# Patient Record
Sex: Female | Born: 1990 | Race: White | Hispanic: No | Marital: Married | Smoking: Former smoker
Health system: Southern US, Community
[De-identification: ages and names within clinical notes are randomized; demographics above are authoritative.]

## PROBLEM LIST (undated history)

## (undated) ENCOUNTER — Inpatient Hospital Stay (HOSPITAL_COMMUNITY): Payer: Self-pay

## (undated) DIAGNOSIS — Z8619 Personal history of other infectious and parasitic diseases: Secondary | ICD-10-CM

## (undated) DIAGNOSIS — IMO0002 Reserved for concepts with insufficient information to code with codable children: Secondary | ICD-10-CM

## (undated) DIAGNOSIS — R87619 Unspecified abnormal cytological findings in specimens from cervix uteri: Secondary | ICD-10-CM

## (undated) DIAGNOSIS — F419 Anxiety disorder, unspecified: Secondary | ICD-10-CM

## (undated) DIAGNOSIS — R87629 Unspecified abnormal cytological findings in specimens from vagina: Secondary | ICD-10-CM

## (undated) DIAGNOSIS — N3010 Interstitial cystitis (chronic) without hematuria: Secondary | ICD-10-CM

## (undated) DIAGNOSIS — F410 Panic disorder [episodic paroxysmal anxiety] without agoraphobia: Secondary | ICD-10-CM

## (undated) HISTORY — PX: BILATERAL SALPINGECTOMY: SHX5743

## (undated) HISTORY — PX: GYNECOLOGIC CRYOSURGERY: SHX857

## (undated) HISTORY — DX: Reserved for concepts with insufficient information to code with codable children: IMO0002

## (undated) HISTORY — DX: Unspecified abnormal cytological findings in specimens from cervix uteri: R87.619

## (undated) HISTORY — DX: Unspecified abnormal cytological findings in specimens from vagina: R87.629

## (undated) HISTORY — DX: Personal history of other infectious and parasitic diseases: Z86.19

---
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
## 2011-04-25 NOTE — L&D Delivery Note (Signed)
Delivery Note  SVD viable female Apgars 8,9 over 2nd degree lac.  Placenta delivered spontaneously intact with 3VC. Repair with 2-0 Chromic with good support and hemostasis noted and R/V exam confirms.  PH art was sent.  [place] cord blood was done.  Mother and baby were doing well.  EBL 300cc  Candice Camp, MD

## 2011-05-02 LAB — OB RESULTS CONSOLE GC/CHLAMYDIA
Chlamydia: NEGATIVE
Gonorrhea: NEGATIVE

## 2011-05-02 LAB — OB RESULTS CONSOLE ABO/RH

## 2011-05-02 LAB — OB RESULTS CONSOLE ANTIBODY SCREEN: Antibody Screen: NEGATIVE

## 2011-06-21 ENCOUNTER — Inpatient Hospital Stay (HOSPITAL_COMMUNITY)
Admission: AD | Admit: 2011-06-21 | Discharge: 2011-06-21 | Disposition: A | Discharge status: Home / Self Care | Payer: Medicaid Other | Source: Ambulatory Visit | Attending: Obstetrics and Gynecology | Admitting: Obstetrics and Gynecology

## 2011-06-21 DIAGNOSIS — A499 Bacterial infection, unspecified: Secondary | ICD-10-CM | POA: Insufficient documentation

## 2011-06-21 DIAGNOSIS — N949 Unspecified condition associated with female genital organs and menstrual cycle: Secondary | ICD-10-CM

## 2011-06-21 DIAGNOSIS — N390 Urinary tract infection, site not specified: Secondary | ICD-10-CM | POA: Insufficient documentation

## 2011-06-21 DIAGNOSIS — O2390 Unspecified genitourinary tract infection in pregnancy, unspecified trimester: Secondary | ICD-10-CM | POA: Insufficient documentation

## 2011-06-21 DIAGNOSIS — O2340 Unspecified infection of urinary tract in pregnancy, unspecified trimester: Secondary | ICD-10-CM

## 2011-06-21 DIAGNOSIS — N760 Acute vaginitis: Secondary | ICD-10-CM | POA: Insufficient documentation

## 2011-06-21 DIAGNOSIS — B9689 Other specified bacterial agents as the cause of diseases classified elsewhere: Secondary | ICD-10-CM

## 2011-06-21 DIAGNOSIS — R1032 Left lower quadrant pain: Secondary | ICD-10-CM | POA: Insufficient documentation

## 2011-06-21 LAB — URINE MICROSCOPIC-ADD ON

## 2011-06-21 LAB — URINALYSIS, ROUTINE W REFLEX MICROSCOPIC
Leukocytes, UA: NEGATIVE
Nitrite: NEGATIVE
Specific Gravity, Urine: >1.03 — ABNORMAL HIGH (ref 1.005–1.030)
pH: 6 (ref 5.0–8.0)

## 2011-06-21 LAB — WET PREP, GENITAL: Yeast Wet Prep HPF POC: NONE SEEN

## 2011-06-21 MED ORDER — METRONIDAZOLE 500 MG PO TABS: 500.0000 mg | ORAL_TABLET | Freq: Two times a day (BID) | ORAL | Status: AC

## 2011-06-21 MED ORDER — CEPHALEXIN 500 MG PO CAPS: 500.0000 mg | ORAL_CAPSULE | Freq: Two times a day (BID) | ORAL | Status: AC

## 2011-06-21 NOTE — ED Provider Notes (Signed)
History     Chief Complaint  Patient presents with  . Abdominal Pain   HPI 21 y.o. G1 at 16 weeks with LLQ pain and dysuria. Pain is worse with activity, resolves with rest. No bleeding, fever/chills, n/v.    No past medical history on file.  No past surgical history on file.  No family history on file.  History  Substance Use Topics  . Smoking status: Not on file  . Smokeless tobacco: Not on file  . Alcohol Use: Not on file    Allergies:  Allergies  Allergen Reactions  . Onion Anaphylaxis and Swelling    Tongue swells and it becomes hard to breathe  . Peanut-Containing Drug Products Swelling    Tongue swells and it becomes hard to breathe     No prescriptions prior to admission    Review of Systems  Constitutional: Negative.   Respiratory: Negative.   Cardiovascular: Negative.   Gastrointestinal: Positive for abdominal pain. Negative for nausea, vomiting, diarrhea and constipation.  Genitourinary: Positive for dysuria. Negative for urgency, frequency, hematuria and flank pain.       Negative for vaginal bleeding, vaginal discharge, dyspareunia  Musculoskeletal: Negative.   Neurological: Negative.   Psychiatric/Behavioral: Negative.    Physical Exam   Blood pressure 105/47, pulse 92, temperature 98.5 F (36.9 C), temperature source Oral, resp. rate 16, height 4\' 11"  (1.499 m), weight 89 lb 8 oz (40.597 kg).  Physical Exam  Nursing note and vitals reviewed. Constitutional: She is oriented to person, place, and time. She appears well-developed and well-nourished. No distress.  Cardiovascular: Normal rate.   Respiratory: Effort normal.  GI: Soft. She exhibits no distension and no mass. There is tenderness (mild LLQ tenderness). There is rebound. There is no guarding.  Genitourinary: Uterus is not enlarged and not tender. Cervix exhibits no motion tenderness, no discharge and no friability. Right adnexum displays no mass, no tenderness and no fullness. Left  adnexum displays no mass, no tenderness and no fullness. No bleeding around the vagina. Vaginal discharge (white) found.       Cervix long and closed  Musculoskeletal: Normal range of motion.  Neurological: She is alert and oriented to person, place, and time.  Skin: Skin is warm and dry.  Psychiatric: She has a normal mood and affect.    MAU Course  Procedures  Results for orders placed during the hospital encounter of 06/21/11 (from the past 24 hour(s))  URINALYSIS, ROUTINE W REFLEX MICROSCOPIC     Status: Abnormal   Collection Time   06/21/11  8:45 PM      Component Value Range   Color, Urine ORANGE (*) YELLOW    APPearance CLEAR  CLEAR    Specific Gravity, Urine >1.030 (*) 1.005 - 1.030    pH 6.0  5.0 - 8.0    Glucose, UA 100 (*) NEGATIVE (mg/dL)   Hgb urine dipstick MODERATE (*) NEGATIVE    Bilirubin Urine NEGATIVE  NEGATIVE    Ketones, ur NEGATIVE  NEGATIVE (mg/dL)   Protein, ur NEGATIVE  NEGATIVE (mg/dL)   Urobilinogen, UA 0.2  0.0 - 1.0 (mg/dL)   Nitrite NEGATIVE  NEGATIVE    Leukocytes, UA NEGATIVE  NEGATIVE   URINE MICROSCOPIC-ADD ON     Status: Abnormal   Collection Time   06/21/11  8:45 PM      Component Value Range   Squamous Epithelial / LPF FEW (*) RARE    WBC, UA 0-2  <3 (WBC/hpf)   RBC / HPF  3-6  <3 (RBC/hpf)   Bacteria, UA FEW (*) RARE   WET PREP, GENITAL     Status: Abnormal   Collection Time   06/21/11  9:45 PM      Component Value Range   Yeast Wet Prep HPF POC NONE SEEN  NONE SEEN    Trich, Wet Prep NONE SEEN  NONE SEEN    Clue Cells Wet Prep HPF POC MODERATE (*) NONE SEEN    WBC, Wet Prep HPF POC FEW (*) NONE SEEN      Assessment and Plan  21 y.o. G1 at 16 weeks Round ligament pain BV - rx flagyl UTI - rx keflex Urine culture sent - pt to call office to f/u on results  Shataria Crist 06/22/2011, 2:22 AM

## 2011-06-21 NOTE — Progress Notes (Signed)
Pt G1 EDC 12/06/2011, having LLQ pain and difficulty with urination.  Denies bleeding.

## 2011-06-24 LAB — GC/CHLAMYDIA PROBE AMP, GENITAL: Chlamydia, DNA Probe: NEGATIVE

## 2011-08-05 ENCOUNTER — Encounter (HOSPITAL_COMMUNITY): Payer: Self-pay | Admitting: *Deleted

## 2011-08-05 ENCOUNTER — Inpatient Hospital Stay (HOSPITAL_COMMUNITY)
Admission: AD | Admit: 2011-08-05 | Discharge: 2011-08-05 | Disposition: A | Discharge status: Home / Self Care | Payer: Medicaid Other | Source: Ambulatory Visit | Attending: Obstetrics and Gynecology | Admitting: Obstetrics and Gynecology

## 2011-08-05 DIAGNOSIS — J069 Acute upper respiratory infection, unspecified: Secondary | ICD-10-CM

## 2011-08-05 DIAGNOSIS — K219 Gastro-esophageal reflux disease without esophagitis: Secondary | ICD-10-CM | POA: Insufficient documentation

## 2011-08-05 DIAGNOSIS — R109 Unspecified abdominal pain: Secondary | ICD-10-CM | POA: Insufficient documentation

## 2011-08-05 DIAGNOSIS — M940 Chondrocostal junction syndrome [Tietze]: Secondary | ICD-10-CM

## 2011-08-05 DIAGNOSIS — R059 Cough, unspecified: Secondary | ICD-10-CM | POA: Insufficient documentation

## 2011-08-05 DIAGNOSIS — R05 Cough: Secondary | ICD-10-CM | POA: Insufficient documentation

## 2011-08-05 HISTORY — DX: Anxiety disorder, unspecified: F41.9

## 2011-08-05 HISTORY — DX: Panic disorder (episodic paroxysmal anxiety): F41.0

## 2011-08-05 LAB — URINALYSIS, ROUTINE W REFLEX MICROSCOPIC
Ketones, ur: NEGATIVE mg/dL
Nitrite: NEGATIVE
Specific Gravity, Urine: 1.01 (ref 1.005–1.030)
pH: 7 (ref 5.0–8.0)

## 2011-08-05 LAB — URINE MICROSCOPIC-ADD ON

## 2011-08-05 MED ORDER — GI COCKTAIL ~~LOC~~
30.0000 mL | Freq: Once | ORAL | Status: AC
  Administered 2011-08-05: 30 mL via ORAL
  Filled 2011-08-05: qty 30

## 2011-08-05 NOTE — MAU Provider Note (Signed)
History   Pt presents today c/o severe heart burn, cough, and abd pain when she coughs. She reports GFM and denies vag bleeding, dc, or any other sx. She was recently diagnosed with a URI and was started on amoxicillin. She is concerned now because of the heartburn and the fact that her chest is painful when touched.  CSN: [id number]  Arrival date and time: 08/05/11 0245   First Provider Initiated Contact with Patient 08/05/11 0350      Chief Complaint  Patient presents with  . Chest Pain  . Shortness of Breath  . Cough  . Sore Throat   HPI  OB History    Grav Para Term Preterm Abortions TAB SAB Ect Mult Living   1               Past Medical History  Diagnosis Date  . Anxiety     Not on meds  . Panic attacks     Not on meds    Past Surgical History  Procedure Date  . Appendectomy 2007  . Cholecystectomy, laparoscopic 2007    Family History  Problem Relation Age of Onset  . Anesthesia problems Other     "allergic to anesthesia"    History  Substance Use Topics  . Smoking status: Former Smoker -- 0.5 packs/day for .5 years    Types: Cigarettes    Quit date: 03/30/2011  . Smokeless tobacco: Not on file  . Alcohol Use: No    Allergies:  Allergies  Allergen Reactions  . Onion Anaphylaxis and Swelling    Tongue swells and it becomes hard to breathe  . Peanut-Containing Drug Products Swelling    Tongue swells and it becomes hard to breathe     Prescriptions prior to admission  Medication Sig Dispense Refill  . amoxicillin (AMOXIL) 250 MG capsule Take 250 mg by mouth 3 (three) times daily.      . Prenatal Vit-Fe Fumarate-FA (PRENATAL MULTIVITAMIN) TABS Take 1 tablet by mouth daily.         Review of Systems  Constitutional: Negative for fever and chills.  Eyes: Negative for blurred vision and double vision.  Respiratory: Positive for cough, sputum production and shortness of breath. Negative for hemoptysis and wheezing.   Cardiovascular: Positive for  chest pain. Negative for palpitations, orthopnea, claudication, leg swelling and PND.  Gastrointestinal: Positive for heartburn and abdominal pain. Negative for nausea, vomiting, diarrhea and constipation.  Genitourinary: Negative for dysuria, urgency, frequency and hematuria.  Neurological: Negative for dizziness and headaches.  Psychiatric/Behavioral: Negative for depression and suicidal ideas.   Physical Exam   Blood pressure 122/62, pulse 89, temperature 98.9 F (37.2 C), temperature source Oral, resp. rate 20, height 4' 11.5" (1.511 m), weight 96 lb 2 oz (43.602 kg), SpO2 100.00%.  Physical Exam  Nursing note and vitals reviewed. Constitutional: She is oriented to person, place, and time. She appears well-developed and well-nourished. No distress.  HENT:  Head: Normocephalic and atraumatic.  Eyes: EOM are normal. Pupils are equal, round, and reactive to light.  Cardiovascular: Normal rate, regular rhythm and normal heart sounds.  Exam reveals no gallop and no friction rub.   No murmur heard. Respiratory: Effort normal and breath sounds normal. No respiratory distress. She has no wheezes. She has no rales. She exhibits tenderness (Pain is exactly reproducible with palpation along sternal border.).  GI: Soft. She exhibits no distension and no mass. There is no tenderness. There is no rebound and no guarding.  Genitourinary:  No bleeding around the vagina. No vaginal discharge found.       Cervix Lg/closed.  Neurological: She is alert and oriented to person, place, and time.  Skin: Skin is warm and dry. She is not diaphoretic.  Psychiatric: She has a normal mood and affect. Her behavior is normal. Judgment and thought content normal.    MAU Course  Procedures  Results for orders placed during the hospital encounter of 08/05/11 (from the past 24 hour(s))  URINALYSIS, ROUTINE W REFLEX MICROSCOPIC     Status: Abnormal   Collection Time   08/05/11  2:50 AM      Component Value Range    Color, Urine YELLOW  YELLOW    APPearance CLEAR  CLEAR    Specific Gravity, Urine 1.010  1.005 - 1.030    pH 7.0  5.0 - 8.0    Glucose, UA NEGATIVE  NEGATIVE (mg/dL)   Hgb urine dipstick SMALL (*) NEGATIVE    Bilirubin Urine NEGATIVE  NEGATIVE    Ketones, ur NEGATIVE  NEGATIVE (mg/dL)   Protein, ur NEGATIVE  NEGATIVE (mg/dL)   Urobilinogen, UA 0.2  0.0 - 1.0 (mg/dL)   Nitrite NEGATIVE  NEGATIVE    Leukocytes, UA MODERATE (*) NEGATIVE   URINE MICROSCOPIC-ADD ON     Status: Abnormal   Collection Time   08/05/11  2:50 AM      Component Value Range   Squamous Epithelial / LPF FEW (*) RARE    WBC, UA 7-10  <3 (WBC/hpf)   RBC / HPF 0-2  <3 (RBC/hpf)   Bacteria, UA RARE  RARE    Urine-Other MUCOUS PRESENT     Urine sent for culture.  Pt heartburn completely resolved following GI Cocktail.  Assessment and Plan  URI: pt is to continue her amoxicillin as directed.  Costochondritis: discussed with pt at length. She may use tylenol for prn pain. Gave precautions.  Abd pain: pt only has pain when she coughs. This should continue to improve.  GERD: advised pt to start Zantac twice daily. She has f/u scheduled with Dr. Vance Gather office. Discussed diet, activity, risks, and precautions.  Clinton Gallant. Nathaly Dawkins III, DrHSc, MPAS, PA-C  08/05/2011, 4:00 AM

## 2011-08-05 NOTE — Discharge Instructions (Signed)
Costochondritis Costochondritis (Tietze syndrome), or costochondral separation, is a swelling and irritation (inflammation) of the tissue (cartilage) that connects your ribs with your breastbone (sternum). It may occur on its own (spontaneously), through damage caused by an accident (trauma), or simply from coughing or minor exercise. It may take up to 6 weeks to get better and longer if you are unable to be conservative in your activities. HOME CARE INSTRUCTIONS   Avoid exhausting physical activity. Try not to strain your ribs during normal activity. This would include any activities using chest, belly (abdominal), and side muscles, especially if heavy weights are used.   Use ice for 15 to 20 minutes per hour while awake for the first 2 days. Place the ice in a plastic bag, and place a towel between the bag of ice and your skin.   Only take over-the-counter or prescription medicines for pain, discomfort, or fever as directed by your caregiver.  SEEK IMMEDIATE MEDICAL CARE IF:   Your pain increases or you are very uncomfortable.   You have a fever.   You develop difficulty with your breathing.   You cough up blood.   You develop worse chest pains, shortness of breath, sweating, or vomiting.   You develop new, unexplained problems (symptoms).  MAKE SURE YOU:   Understand these instructions.   Will watch your condition.   Will get help right away if you are not doing well or get worse.  Document Released: 01/18/2005 Document Revised: 03/30/2011 Document Reviewed: 11/27/2007 [facility] Patient Information 2012 [place], [place].Upper Respiratory Infection, Adult An upper respiratory infection (URI) is also known as the common cold. It is often caused by a type of germ (virus). Colds are easily spread (contagious). You can pass it to others by kissing, coughing, sneezing, or drinking out of the same glass. Usually, you get better in 1 or 2 weeks.  HOME CARE   Only take medicine as told by  your doctor.   Use a warm mist humidifier or breathe in steam from a hot shower.   Drink enough water and fluids to keep your pee (urine) clear or pale yellow.   Get plenty of rest.   Return to work when your temperature is back to normal or as told by your doctor. You may use a face mask and wash your hands to stop your cold from spreading.  GET HELP RIGHT AWAY IF:   After the first few days, you feel you are getting worse.   You have questions about your medicine.   You have chills, shortness of breath, or brown or red spit (mucus).   You have yellow or brown snot (nasal discharge) or pain in the face, especially when you bend forward.   You have a fever, puffy (swollen) neck, pain when you swallow, or white spots in the back of your throat.   You have a bad headache, ear pain, sinus pain, or chest pain.   You have a high-pitched whistling sound when you breathe in and out (wheezing).   You have a lasting cough or cough up blood.   You have sore muscles or a stiff neck.  MAKE SURE YOU:   Understand these instructions.   Will watch your condition.   Will get help right away if you are not doing well or get worse.  Document Released: 09/27/2007 Document Revised: 03/30/2011 Document Reviewed: 08/15/2010 [facility] Patient Information 2012 [place], [place].

## 2011-08-05 NOTE — MAU Note (Signed)
Pt states, " Three days ago I woke up and couldn't talk, and then that night my throat was  sore. I went to an urgent care in [place] and they gave me amoxicillin 250mg  three times a day. That night I started getting acid reflux, and it has continued until now my chest just hurts. I have a dry cough, and when I cough it hurts in my right lower abdomen. I've had a low back ache for three days."  .

## 2011-08-06 LAB — URINE CULTURE
Colony Count: NO GROWTH
Culture  Setup Time: 201304131127
Culture: NO GROWTH

## 2011-09-11 ENCOUNTER — Inpatient Hospital Stay (HOSPITAL_COMMUNITY)
Admission: AD | Admit: 2011-09-11 | Discharge: 2011-09-11 | Disposition: A | Discharge status: Home / Self Care | Payer: Medicaid Other | Source: Ambulatory Visit | Attending: Obstetrics and Gynecology | Admitting: Obstetrics and Gynecology

## 2011-09-11 ENCOUNTER — Inpatient Hospital Stay (HOSPITAL_COMMUNITY): Payer: Medicaid Other

## 2011-09-11 DIAGNOSIS — O26879 Cervical shortening, unspecified trimester: Secondary | ICD-10-CM

## 2011-09-11 DIAGNOSIS — N949 Unspecified condition associated with female genital organs and menstrual cycle: Secondary | ICD-10-CM

## 2011-09-11 DIAGNOSIS — O479 False labor, unspecified: Secondary | ICD-10-CM

## 2011-09-11 DIAGNOSIS — O4700 False labor before 37 completed weeks of gestation, unspecified trimester: Secondary | ICD-10-CM | POA: Insufficient documentation

## 2011-09-11 LAB — URINALYSIS, ROUTINE W REFLEX MICROSCOPIC
Bilirubin Urine: NEGATIVE
Ketones, ur: NEGATIVE mg/dL
Nitrite: NEGATIVE
Protein, ur: NEGATIVE mg/dL
Specific Gravity, Urine: 1.01 (ref 1.005–1.030)
Urobilinogen, UA: 0.2 mg/dL (ref 0.0–1.0)

## 2011-09-11 LAB — URINE MICROSCOPIC-ADD ON

## 2011-09-11 NOTE — MAU Provider Note (Signed)
Chief Complaint:  No chief complaint on file.      HPI  Melissa Ware is a 21 y.o. G1P0 at [redacted]w[redacted]d presenting with 3 day hx of left groin pain radiating to left thigh. The pain is constant and is worse when she sits. She is also having intermittent brief sharp left mid back pain. Also feels abdomen "tightening." Denies irritative vaginal discharge, leakage of fluid or vaginal bleeding. Good fetal movement.   Pregnancy Course: uncomplicated except UTI x1; hx cryo  Past Medical History: Past Medical History  Diagnosis Date  . Anxiety     Not on meds  . Panic attacks     Not on meds    Past Surgical History: Past Surgical History  Procedure Date  . Appendectomy 2007  . Cholecystectomy, laparoscopic 2007    Family History: Family History  Problem Relation Age of Onset  . Anesthesia problems Other     "allergic to anesthesia"    Social History: History  Substance Use Topics  . Smoking status: Former Smoker -- 0.5 packs/day for .5 years    Types: Cigarettes    Quit date: 03/30/2011  . Smokeless tobacco: Not on file  . Alcohol Use: No    Allergies:  Allergies  Allergen Reactions  . Onion Anaphylaxis and Swelling    Tongue swells and it becomes hard to breathe  . Peanut-Containing Drug Products Swelling    Tongue swells and it becomes hard to breathe     Meds:  Prescriptions prior to admission  Medication Sig Dispense Refill  . Prenatal Vit-Fe Fumarate-FA (PRENATAL MULTIVITAMIN) TABS Take 1 tablet by mouth daily.           Physical Exam  Blood pressure 147/98, pulse 116, temperature 98.3 F (36.8 C), temperature source Oral, resp. rate 20, SpO2 98.00%. GENERAL: Well-developed, well-nourished female in no acute distress.  HEENT: normocephalic, good dentition HEART: normal rate RESP: normal effort ABDOMEN: Soft, nontender, nondistended, gravid.  EXTREMITIES: Nontender, no edema NEURO: alert and oriented VE: closed/50/-3 cephalic     FHT:  Baseline  135-140 , moderate variability, accelerations present, no decelerations Contractions: q 1.5-4 mins with uterine irritability   Labs:  Results for orders placed during the hospital encounter of 09/11/11 (from the past 24 hour(s))  URINALYSIS, ROUTINE W REFLEX MICROSCOPIC     Status: Abnormal   Collection Time   09/11/11  9:00 PM      Component Value Range   Color, Urine YELLOW  YELLOW    APPearance CLEAR  CLEAR    Specific Gravity, Urine 1.010  1.005 - 1.030    pH 6.0  5.0 - 8.0    Glucose, UA NEGATIVE  NEGATIVE (mg/dL)   Hgb urine dipstick TRACE (*) NEGATIVE    Bilirubin Urine NEGATIVE  NEGATIVE    Ketones, ur NEGATIVE  NEGATIVE (mg/dL)   Protein, ur NEGATIVE  NEGATIVE (mg/dL)   Urobilinogen, UA 0.2  0.0 - 1.0 (mg/dL)   Nitrite NEGATIVE  NEGATIVE    Leukocytes, UA NEGATIVE  NEGATIVE   URINE MICROSCOPIC-ADD ON     Status: Abnormal   Collection Time   09/11/11  9:00 PM      Component Value Range   Squamous Epithelial / LPF RARE  RARE    WBC, UA 0-2  <3 (WBC/hpf)   RBC / HPF 3-6  <3 (RBC/hpf)   Bacteria, UA FEW (*) RARE   FETAL FIBRONECTIN     Status: Normal   Collection Time   09/11/11  9:56 PM      Component Value Range   Fetal Fibronectin NEGATIVE  NEGATIVE   13440   Imaging:  [place] : cx length 3.1  MAU Course: After po hydration UC's diminished to UI and after return from [place] pain resolved  Assessment: G1 @ [redacted]w[redacted]d Preterm contractions   Plan: C/W Dr. Arelia Sneddon. Discharge. Stay well hydrated, pelvic rest until next PNV. Call office if abdominal tightening recurs.     Melissa Ware 5/20/201310:03 PM

## 2011-09-11 NOTE — MAU Note (Signed)
C/o intermittant pain in left groin area that shoots into left upper thigh.  Also states of back pain.

## 2011-10-07 ENCOUNTER — Encounter (HOSPITAL_COMMUNITY): Payer: Self-pay | Admitting: *Deleted

## 2011-10-07 ENCOUNTER — Inpatient Hospital Stay (HOSPITAL_COMMUNITY)
Admission: AD | Admit: 2011-10-07 | Discharge: 2011-10-07 | Disposition: A | Discharge status: Home / Self Care | Payer: Medicaid Other | Source: Ambulatory Visit | Attending: Obstetrics and Gynecology | Admitting: Obstetrics and Gynecology

## 2011-10-07 DIAGNOSIS — N949 Unspecified condition associated with female genital organs and menstrual cycle: Secondary | ICD-10-CM | POA: Insufficient documentation

## 2011-10-07 DIAGNOSIS — Z331 Pregnant state, incidental: Secondary | ICD-10-CM

## 2011-10-07 DIAGNOSIS — O2390 Unspecified genitourinary tract infection in pregnancy, unspecified trimester: Secondary | ICD-10-CM | POA: Insufficient documentation

## 2011-10-07 DIAGNOSIS — R109 Unspecified abdominal pain: Secondary | ICD-10-CM | POA: Insufficient documentation

## 2011-10-07 DIAGNOSIS — N390 Urinary tract infection, site not specified: Secondary | ICD-10-CM

## 2011-10-07 LAB — URINALYSIS, ROUTINE W REFLEX MICROSCOPIC
Glucose, UA: NEGATIVE mg/dL
Nitrite: NEGATIVE
Specific Gravity, Urine: 1.02 (ref 1.005–1.030)
pH: 7 (ref 5.0–8.0)

## 2011-10-07 LAB — URINE MICROSCOPIC-ADD ON

## 2011-10-07 MED ORDER — NITROFURANTOIN MONOHYD MACRO 100 MG PO CAPS: 100.0000 mg | ORAL_CAPSULE | Freq: Two times a day (BID) | ORAL | Status: AC

## 2011-10-07 NOTE — MAU Provider Note (Signed)
History     CSN: [id number]  Arrival date and time: 10/07/11 1215   First Provider Initiated Contact with Patient 10/07/11 1422      Chief Complaint  Patient presents with  . Abdominal Pain  . Vaginal Discharge   HPI  Patient is [redacted] weeks pregnant, for past three days has felt pressure, having vaginal discharge white to clear, no odor or itching.  Denies vaginal bleeding.  +fetal movement.  Last intercourse in November 2012.   Past Medical History  Diagnosis Date  . Anxiety     Not on meds  . Panic attacks     Not on meds    Past Surgical History  Procedure Date  . Appendectomy 2007  . Cholecystectomy, laparoscopic 2007    Family History  Problem Relation Age of Onset  . Anesthesia problems Other     "allergic to anesthesia"    History  Substance Use Topics  . Smoking status: Former Smoker -- 0.5 packs/day for .5 years    Types: Cigarettes    Quit date: 03/30/2011  . Smokeless tobacco: Not on file  . Alcohol Use: No    Allergies:  Allergies  Allergen Reactions  . Onion Anaphylaxis and Swelling    Tongue swells and it becomes hard to breathe  . Peanut-Containing Drug Products Swelling    Tongue swells and it becomes hard to breathe     Prescriptions prior to admission  Medication Sig Dispense Refill  . acetaminophen (TYLENOL) 325 MG tablet Take 650 mg by mouth every 6 (six) hours as needed. For pain.      . calcium carbonate (TUMS - DOSED IN MG ELEMENTAL CALCIUM) 500 MG chewable tablet Chew 1 tablet by mouth daily. For heartburn.      . Prenatal Vit-Fe Fumarate-FA (PRENATAL MULTIVITAMIN) TABS Take 1 tablet by mouth daily.       . ranitidine (ZANTAC) 75 MG tablet Take 75 mg by mouth 2 (two) times daily. For heartburn.        Review of Systems  Genitourinary:       Pelvic pressure, vaginal discharge  All other systems reviewed and are negative.   Physical Exam   Blood pressure 115/67, pulse 104, temperature 98.4 F (36.9 C), temperature source  Oral, resp. rate 16, height 4\' 11"  (1.499 m), weight 49.261 kg (108 lb 9.6 oz).  Physical Exam  Constitutional: She is oriented to person, place, and time. She appears well-developed and well-nourished. No distress.  HENT:  Head: Normocephalic.  Neck: Normal range of motion. Neck supple.  Cardiovascular: Normal rate, regular rhythm and normal heart sounds.   Respiratory: Effort normal and breath sounds normal.  GI: Soft. There is no tenderness.  Genitourinary: No bleeding around the vagina. Vaginal discharge (mucusy) found.       [place] neg; pooling negative Cervix - closed  Neurological: She is alert and oriented to person, place, and time.  Skin: Skin is warm and dry.    MAU Course  Procedures  Results for orders placed during the hospital encounter of 10/07/11 (from the past 24 hour(s))  URINALYSIS, ROUTINE W REFLEX MICROSCOPIC     Status: Abnormal   Collection Time   10/07/11 12:30 PM      Component Value Range   Color, Urine YELLOW  YELLOW   APPearance HAZY (*) CLEAR   Specific Gravity, Urine 1.020  1.005 - 1.030   pH 7.0  5.0 - 8.0   Glucose, UA NEGATIVE  NEGATIVE mg/dL   Hgb  urine dipstick SMALL (*) NEGATIVE   Bilirubin Urine NEGATIVE  NEGATIVE   Ketones, ur NEGATIVE  NEGATIVE mg/dL   Protein, ur NEGATIVE  NEGATIVE mg/dL   Urobilinogen, UA 0.2  0.0 - 1.0 mg/dL   Nitrite NEGATIVE  NEGATIVE   Leukocytes, UA SMALL (*) NEGATIVE  URINE MICROSCOPIC-ADD ON     Status: Abnormal   Collection Time   10/07/11 12:30 PM      Component Value Range   Squamous Epithelial / LPF FEW (*) RARE   WBC, UA 7-10  <3 WBC/hpf   RBC / HPF 7-10  <3 RBC/hpf   Bacteria, UA FEW (*) RARE  WET PREP, GENITAL     Status: Abnormal   Collection Time   10/07/11  2:40 PM      Component Value Range   Yeast Wet Prep HPF POC NONE SEEN  NONE SEEN   Trich, Wet Prep NONE SEEN  NONE SEEN   Clue Cells Wet Prep HPF POC NONE SEEN  NONE SEEN   WBC, Wet Prep HPF POC FEW (*) NONE SEEN   FHR 130's, +accels,  reactive [place] - intermittent, not regular  Assessment and Plan  UTI  Plan: DC to home RX Macrobid Urine Culture Keep scheduled appt  [facility] 10/07/2011, 2:23 PM

## 2011-10-07 NOTE — Discharge Instructions (Signed)
Urinary Tract Infection in Pregnancy A urinary tract infection (UTI) is a bacterial infection of the urinary tract. Infection of the urinary tract can include the ureters, kidneys (pyelonephritis), bladder (cystitis), and urethra (urethritis). All pregnant women should be screened for bacteria in the urinary tract. Identifying and treating a UTI will decrease the risk of preterm labor and developing more serious infections in both the mother and baby. CAUSES Bacteria germs cause almost all UTIs. There are many factors that can increase your chances of getting a UTI during pregnancy. These include:  Having a short urethra.   Poor toilet and hygiene habits.   Sexual intercourse.   Blockage of urine along the urinary tract.   Problems with the pelvic muscles or nerves.   Diabetes.   Obesity.   Bladder problems after having several children.   Previous history of UTI.  SYMPTOMS   Pain, burning, or a stinging feeling when urinating.   Suddenly feeling the need to urinate right away (urgency).   Loss of bladder control (urinary incontinence).   Frequent urination, more than is common with pregnancy.   Lower abdominal or back discomfort.   Bad smelling urine.   Cloudy urine.   Blood in the urine (hematuria).   Fever.  When the kidneys are infected, the symptoms may be:  Back pain.   Flank pain on the right side more so than the left.   Fever.   Chills.   Nausea.   Vomiting.  DIAGNOSIS   Urine tests.   Additional tests and procedures may include:   Ultrasound of the kidneys, ureters, bladder, and urethra.   Looking in the bladder with a lighted tube (cystoscopy).   Certain X-ray studies only when absolutely necessary.  Finding out the results of your test Ask when your test results will be ready. Make sure you get your test results. TREATMENT  Antibiotic medicine by mouth.   Antibiotics given through the vein (intravenously), if needed.  HOME CARE  INSTRUCTIONS   Take your antibiotics as directed. Finish them even if you start to feel better. Only take medicine as directed by your caregiver.   Drink enough fluids to keep your urine clear or pale yellow.   Do not have sexual intercourse until the infection is gone and your caregiver says it is okay.   Make sure you are tested for UTIs throughout your pregnancy if you get one. These infections often come back.  Preventing a UTI in the future:  Practice good toilet habits. Always wipe from front to back. Use the tissue only once.   Do not hold your urine. Empty your bladder as soon as possible when the urge comes.   Do not douche or use deodorant sprays.   Wash with soap and warm water around the genital area and the anus.   Empty your bladder before and after sexual intercourse.   Wear underwear with a cotton crotch.   Avoid caffeine and carbonated drinks. They can irritate the bladder.   Drink cranberry juice or take cranberry pills. This may decrease the risk of getting a UTI.   Do not drink alcohol.   Keep all your appointments and tests as scheduled.  SEEK MEDICAL CARE IF:   Your symptoms get worse.   You are still having fevers 2 or more days after treatment begins.   You develop a rash.   You feel that you are having problems with medicines prescribed.   You develop abnormal vaginal discharge.  SEEK IMMEDIATE MEDICAL   CARE IF:   You develop back or flank pain.   You develop chills.   You have blood in your urine.   You develop nausea and vomiting.   You develop contractions of your uterus.   You have a gush of fluid from the vagina.  MAKE SURE YOU:   Understand these instructions.   Will watch your condition.   Will get help right away if you are not doing well or get worse.  Document Released: 08/05/2010 Document Revised: 03/30/2011 Document Reviewed: 08/05/2010 [employer] Patient Information 2012 [employer]. 

## 2011-10-07 NOTE — MAU Note (Signed)
Called on call nurse from doctors office. Having some leaking and vaginal pressure.

## 2011-10-07 NOTE — MAU Note (Signed)
Patient is [redacted] weeks pregnant, for past few days has felt pressure, having vaginal discharge white to clear.

## 2011-10-20 ENCOUNTER — Inpatient Hospital Stay (HOSPITAL_COMMUNITY)
Admission: AD | Admit: 2011-10-20 | Discharge: 2011-10-20 | Disposition: A | Discharge status: Home / Self Care | Payer: PRIVATE HEALTH INSURANCE | Source: Ambulatory Visit | Attending: Obstetrics and Gynecology | Admitting: Obstetrics and Gynecology

## 2011-10-20 ENCOUNTER — Encounter (HOSPITAL_COMMUNITY): Payer: Self-pay | Admitting: *Deleted

## 2011-10-20 DIAGNOSIS — A499 Bacterial infection, unspecified: Secondary | ICD-10-CM | POA: Insufficient documentation

## 2011-10-20 DIAGNOSIS — O4700 False labor before 37 completed weeks of gestation, unspecified trimester: Secondary | ICD-10-CM

## 2011-10-20 DIAGNOSIS — B9689 Other specified bacterial agents as the cause of diseases classified elsewhere: Secondary | ICD-10-CM | POA: Insufficient documentation

## 2011-10-20 DIAGNOSIS — N760 Acute vaginitis: Secondary | ICD-10-CM | POA: Insufficient documentation

## 2011-10-20 DIAGNOSIS — O479 False labor, unspecified: Secondary | ICD-10-CM

## 2011-10-20 DIAGNOSIS — O2390 Unspecified genitourinary tract infection in pregnancy, unspecified trimester: Secondary | ICD-10-CM | POA: Insufficient documentation

## 2011-10-20 LAB — WET PREP, GENITAL
Trich, Wet Prep: NONE SEEN
Yeast Wet Prep HPF POC: NONE SEEN

## 2011-10-20 LAB — URINALYSIS, ROUTINE W REFLEX MICROSCOPIC
Bilirubin Urine: NEGATIVE
Protein, ur: NEGATIVE mg/dL
Urobilinogen, UA: 0.2 mg/dL (ref 0.0–1.0)

## 2011-10-20 LAB — URINE MICROSCOPIC-ADD ON

## 2011-10-20 MED ORDER — NIFEDIPINE 10 MG PO CAPS: 10.0000 mg | ORAL_CAPSULE | Freq: Three times a day (TID) | ORAL | Status: DC

## 2011-10-20 MED ORDER — TERBUTALINE SULFATE 1 MG/ML IJ SOLN
0.2500 mg | Freq: Once | INTRAMUSCULAR | Status: DC
  Filled 2011-10-20: qty 1

## 2011-10-20 MED ORDER — METRONIDAZOLE 500 MG PO TABS: 500.0000 mg | ORAL_TABLET | Freq: Two times a day (BID) | ORAL | Status: AC

## 2011-10-20 NOTE — MAU Note (Signed)
Patient states she was sent from the office for evaluation of preterm labor. Was 50% and closed in the office. Having back and abdominal pain. Denies bleeding or leaking and reports good fetal movement.

## 2011-10-20 NOTE — Discharge Instructions (Signed)
Preventing Preterm Labor Preterm labor is when a pregnant woman has contractions that cause the cervix to open, shorten, and thin before 37 weeks of pregnancy. You will have regular contractions (tightening) 2 to 3 minutes apart. This usually causes discomfort or pain. HOME CARE  Eat a healthy diet.   Take your vitamins as told by your doctor.   Drink enough fluids to keep your pee (urine) clear or pale yellow every day.   Get rest and sleep.   Do not have sex if you are at high risk for preterm labor.   Follow your doctor's advice about activity, medicines, and tests.   Avoid stress.   Avoid hard labor or exercise that lasts for a long time.   Do not smoke.  GET HELP RIGHT AWAY IF:   You are having contractions.   You have belly (abdominal) pain.   You have bleeding from your vagina.   You have pain when you pee (urinate).   You have abnormal discharge from your vagina.   You have a temperature by mouth above 102 F (38.9 C).  MAKE SURE YOU:  Understand these instructions.   Will watch your condition.   Will get help if you are not doing well or get worse.  Document Released: 07/07/2008 Document Revised: 03/30/2011 Document Reviewed: 07/07/2008 [employer] Patient Information 2012 [employer]. 

## 2011-10-20 NOTE — MAU Provider Note (Addendum)
History     CSN: [id number]  Arrival date and time: 10/20/11 1207   First Provider Initiated Contact with Patient 10/20/11 1420      Chief Complaint  Patient presents with  . Labor Eval   HPI This is a 21 yo G1 at 33.2 weeks seen at [facility] who was sent here from the clinic due to contractions.  Contractions started last night around 10pm and are regular - approximately every 4-5 minutes.  No change in severity - noted to be moderate.  No provoking or palliating factors.  Was closed and 50% in office.  Denies brownish discharge.  OB History    Grav Para Term Preterm Abortions TAB SAB Ect Mult Living   1               Past Medical History  Diagnosis Date  . Anxiety     Not on meds  . Panic attacks     Not on meds    Past Surgical History  Procedure Date  . Appendectomy 2007  . Cholecystectomy, laparoscopic 2007    Family History  Problem Relation Age of Onset  . Anesthesia problems Other     "allergic to anesthesia"  . Other Neg Hx     History  Substance Use Topics  . Smoking status: Former Smoker -- 0.5 packs/day for .5 years    Types: Cigarettes    Quit date: 03/30/2011  . Smokeless tobacco: Not on file  . Alcohol Use: No    Allergies:  Allergies  Allergen Reactions  . Onion Anaphylaxis and Swelling    Tongue swells and it becomes hard to breathe  . Peanut-Containing Drug Products Swelling    Tongue swells and it becomes hard to breathe     Prescriptions prior to admission  Medication Sig Dispense Refill  . acetaminophen (TYLENOL) 325 MG tablet Take 650 mg by mouth every 6 (six) hours as needed. For pain.      . calcium carbonate (TUMS - DOSED IN MG ELEMENTAL CALCIUM) 500 MG chewable tablet Chew 1 tablet by mouth daily. For heartburn.      . Prenatal Vit-Fe Fumarate-FA (PRENATAL MULTIVITAMIN) TABS Take 1 tablet by mouth daily.       . ranitidine (ZANTAC) 75 MG tablet Take 75 mg by mouth 2 (two) times daily. For heartburn.         ROS Physical Exam   Blood pressure 117/73, pulse 96, temperature 98.7 F (37.1 C), temperature source Oral, resp. rate 16, height 4' 11.5" (1.511 m), weight 50.44 kg (111 lb 3.2 oz), SpO2 99.00%.  Physical Exam  Constitutional: She is oriented to person, place, and time. She appears well-developed and well-nourished.  HENT:  Head: Normocephalic and atraumatic.  GI: Soft. She exhibits no distension and no mass. There is no tenderness. There is no rebound and no guarding.  Genitourinary: Vagina normal.       Cervix visually closed.  No discharge.  Musculoskeletal: She exhibits no edema.  Neurological: She is alert and oriented to person, place, and time.  Psychiatric: She has a normal mood and affect. Her behavior is normal. Judgment and thought content normal.   Dilation: Closed Effacement (%): 50 Station: -2 Exam by:: Dr. Adrian Blackwater  Results for orders placed during the hospital encounter of 10/20/11 (from the past 24 hour(s))  WET PREP, GENITAL     Status: Abnormal   Collection Time   10/20/11  1:15 PM      Component Value Range  Yeast Wet Prep HPF POC NONE SEEN  NONE SEEN   Trich, Wet Prep NONE SEEN  NONE SEEN   Clue Cells Wet Prep HPF POC FEW (*) NONE SEEN   WBC, Wet Prep HPF POC FEW (*) NONE SEEN  URINALYSIS, ROUTINE W REFLEX MICROSCOPIC     Status: Abnormal   Collection Time   10/20/11  1:40 PM      Component Value Range   Color, Urine YELLOW  YELLOW   APPearance CLEAR  CLEAR   Specific Gravity, Urine 1.015  1.005 - 1.030   pH 7.5  5.0 - 8.0   Glucose, UA NEGATIVE  NEGATIVE mg/dL   Hgb urine dipstick TRACE (*) NEGATIVE   Bilirubin Urine NEGATIVE  NEGATIVE   Ketones, ur NEGATIVE  NEGATIVE mg/dL   Protein, ur NEGATIVE  NEGATIVE mg/dL   Urobilinogen, UA 0.2  0.0 - 1.0 mg/dL   Nitrite NEGATIVE  NEGATIVE   Leukocytes, UA NEGATIVE  NEGATIVE  URINE MICROSCOPIC-ADD ON     Status: Abnormal   Collection Time   10/20/11  1:40 PM      Component Value Range   Squamous  Epithelial / LPF FEW (*) RARE   RBC / HPF 0-2  <3 RBC/hpf   Bacteria, UA RARE  RARE     MAU Course  Procedures NST: category 1 tracing.  Accelerations seen.  Baseline 135.  No decel.  Initial contractions every 5 minutes.  MDM Terbutaline 0.25mg  given [place].  Contractions spaced out and resolved.    Assessment and Plan  1. Preterm Labor 2. BV  Will send home with procardia, pelvic rest, bedrest.  Patient instructed to follow up on Monday.  Also given prescription for flagyl for BV.  Patient discussed with Dr Arelia Sneddon, who agreed with the above management of the patient.  Carrisa Keller JEHIEL 10/20/2011, 2:23 PM

## 2011-10-21 ENCOUNTER — Inpatient Hospital Stay (HOSPITAL_COMMUNITY)
Admission: AD | Admit: 2011-10-21 | Discharge: 2011-10-21 | Disposition: A | Discharge status: Home / Self Care | Payer: Commercial Managed Care - PPO | Source: Ambulatory Visit | Attending: Obstetrics and Gynecology | Admitting: Obstetrics and Gynecology

## 2011-10-21 ENCOUNTER — Encounter (HOSPITAL_COMMUNITY): Payer: Self-pay | Admitting: *Deleted

## 2011-10-21 DIAGNOSIS — O4702 False labor before 37 completed weeks of gestation, second trimester: Secondary | ICD-10-CM

## 2011-10-21 DIAGNOSIS — O4700 False labor before 37 completed weeks of gestation, unspecified trimester: Secondary | ICD-10-CM | POA: Insufficient documentation

## 2011-10-21 LAB — GC/CHLAMYDIA PROBE AMP, GENITAL: GC Probe Amp, Genital: NEGATIVE

## 2011-10-21 NOTE — MAU Provider Note (Signed)
History     CSN: [id number]  Arrival date and time: 10/21/11 1348   First Provider Initiated Contact with Patient 10/21/11 1451      Chief Complaint  Patient presents with  . Vaginal Discharge   HPI Melissa Ware 21 y.o. [redacted]w[redacted]d Was her on 10-20-11 with contractions.  Cervix was closed and was given Procardia.  Today returns as she passed her mucus plug.  Was thick mucus and brown blood seen.  Was very concerned.  Is taking Procardia.  Feeling an occasional contraction but barely feels it.   Pain is much less than contractions yesterday.  OB History    Grav Para Term Preterm Abortions TAB SAB Ect Mult Living   1               Past Medical History  Diagnosis Date  . Anxiety     Not on meds  . Panic attacks     Not on meds    Past Surgical History  Procedure Date  . Appendectomy 2007  . Cholecystectomy, laparoscopic 2007    Family History  Problem Relation Age of Onset  . Anesthesia problems Other     "allergic to anesthesia"  . Other Neg Hx     History  Substance Use Topics  . Smoking status: Former Smoker -- 0.5 packs/day for .5 years    Types: Cigarettes    Quit date: 03/30/2011  . Smokeless tobacco: Not on file  . Alcohol Use: No    Allergies:  Allergies  Allergen Reactions  . Onion Anaphylaxis and Swelling    Tongue swells and it becomes hard to breathe  . Peanut-Containing Drug Products Swelling    Tongue swells and it becomes hard to breathe     Prescriptions prior to admission  Medication Sig Dispense Refill  . acetaminophen (TYLENOL) 325 MG tablet Take 650 mg by mouth every 6 (six) hours as needed. For pain.      . calcium carbonate (TUMS - DOSED IN MG ELEMENTAL CALCIUM) 500 MG chewable tablet Chew 1 tablet by mouth daily. For heartburn.      . metroNIDAZOLE (FLAGYL) 500 MG tablet Take 1 tablet (500 mg total) by mouth 2 (two) times daily.  14 tablet  0  . NIFEdipine (PROCARDIA) 10 MG capsule Take 1 capsule (10 mg total) by mouth 3 (three)  times daily.  60 capsule  2  . Prenatal Vit-Fe Fumarate-FA (PRENATAL MULTIVITAMIN) TABS Take 1 tablet by mouth at bedtime.       . ranitidine (ZANTAC) 75 MG tablet Take 75 mg by mouth 2 (two) times daily. For heartburn.        Review of Systems  Genitourinary:       No vaginal discharge. No red vaginal bleeding. Having some brown discharge. No dysuria.   Physical Exam   Blood pressure 115/68, pulse 107, temperature 98.7 F (37.1 C), temperature source Oral, resp. rate 16, SpO2 100.00%.  Physical Exam  Nursing note and vitals reviewed. Constitutional: She is oriented to person, place, and time. She appears well-developed and well-nourished.  HENT:  Head: Normocephalic.  Eyes: EOM are normal.  Neck: Neck supple.  GI: Soft. There is no tenderness.  Genitourinary:       Speculum exam Small amount of old brown blood seen in vagina.  No mucus seen.   Cervix appears closed. Bimanual exam: Cervix very soft, internal os closed No active bleeding.  Musculoskeletal: Normal range of motion.  Neurological: She is alert and oriented  to person, place, and time.  Skin: Skin is warm and dry.  Psychiatric: She has a normal mood and affect.    MAU Course  Procedures  MDM Taking Procardia.  No change in cervix today.  Client feeling better today than yesterday.  Was just worried about loss of mucus plug.   Dr. Arelia Sneddon notified of client's visit today.  Assessment and Plan  False labor  Plan Continue Procardia as you have been taking. Your cervix is closed today. Return if you have red bloody discharge or worsening contractions. Call your doctor if you have questions or concerns. Keep your appointments in the office as scheduled.    Melissa Ware 10/21/2011, 3:03 PM

## 2011-10-21 NOTE — Discharge Instructions (Signed)
Continue Procardia as you have been taking. Your cervix is closed today. Return if you have red bloody discharge or worsening contractions. Call your doctor if you have questions or concerns. Keep your appointments in the office as scheduled.

## 2011-10-21 NOTE — MAU Note (Signed)
Patient states she has passed her mucus plug. Patient is currently on Procardia with last does at 1000. Denies any pain. Reports fetal movement but not since mucus plug came out at 1202. Fetal heart tones in triage 134-140.

## 2011-10-27 ENCOUNTER — Inpatient Hospital Stay (HOSPITAL_COMMUNITY)
Admission: AD | Admit: 2011-10-27 | Discharge: 2011-10-27 | Disposition: A | Discharge status: Home / Self Care | Payer: Commercial Managed Care - PPO | Source: Ambulatory Visit | Attending: Obstetrics and Gynecology | Admitting: Obstetrics and Gynecology

## 2011-10-27 ENCOUNTER — Encounter (HOSPITAL_COMMUNITY): Payer: Self-pay | Admitting: *Deleted

## 2011-10-27 DIAGNOSIS — O99891 Other specified diseases and conditions complicating pregnancy: Secondary | ICD-10-CM | POA: Insufficient documentation

## 2011-10-27 DIAGNOSIS — N859 Noninflammatory disorder of uterus, unspecified: Secondary | ICD-10-CM

## 2011-10-27 DIAGNOSIS — O26899 Other specified pregnancy related conditions, unspecified trimester: Secondary | ICD-10-CM

## 2011-10-27 DIAGNOSIS — N898 Other specified noninflammatory disorders of vagina: Secondary | ICD-10-CM

## 2011-10-27 NOTE — MAU Note (Signed)
Pt is on bedrest at home and is taking Procardia; C/o ?srom;

## 2011-10-27 NOTE — MAU Note (Signed)
Patient states she had a gush of clear fluid at 1030, was watery. Some abdominal tightening. Reports good fetal movement, no bleeding.

## 2011-10-27 NOTE — MAU Provider Note (Signed)
History     CSN: [id number]  Arrival date and time: 10/27/11 1306   First Provider Initiated Contact with Patient 10/27/11 1435      Chief Complaint  Patient presents with  . Rupture of Membranes   HPI This is a 21 y.o. female at [redacted]w[redacted]d who presents for one episode of leaking "something" at home. States was "sort of wet" and had a small amount of yellow. No bleeding. Has cramps and irregular contractions. Is already on Procardia for PTL. Takes it every 6 hrs.   OB History    Grav Para Term Preterm Abortions TAB SAB Ect Mult Living   1               Past Medical History  Diagnosis Date  . Anxiety     Not on meds  . Panic attacks     Not on meds    Past Surgical History  Procedure Date  . Appendectomy 2007  . Cholecystectomy, laparoscopic 2007    Family History  Problem Relation Age of Onset  . Anesthesia problems Other     "allergic to anesthesia"  . Other Neg Hx   . Hypertension Mother   . Hypertension Maternal Grandmother   . Diabetes Maternal Grandfather     History  Substance Use Topics  . Smoking status: Former Smoker -- 0.5 packs/day for .5 years    Types: Cigarettes    Quit date: 03/30/2011  . Smokeless tobacco: Not on file  . Alcohol Use: No    Allergies:  Allergies  Allergen Reactions  . Onion Anaphylaxis and Swelling    Tongue swells and it becomes hard to breathe  . Peanut-Containing Drug Products Swelling    Tongue swells and it becomes hard to breathe     Prescriptions prior to admission  Medication Sig Dispense Refill  . acetaminophen (TYLENOL) 325 MG tablet Take 650 mg by mouth every 6 (six) hours as needed. For pain.      . calcium carbonate (TUMS - DOSED IN MG ELEMENTAL CALCIUM) 500 MG chewable tablet Chew 1 tablet by mouth daily. For heartburn.      Marland Kitchen NIFEdipine (PROCARDIA) 10 MG capsule Take 1 capsule (10 mg total) by mouth 3 (three) times daily.  60 capsule  2  . Prenatal Vit-Fe Fumarate-FA (PRENATAL MULTIVITAMIN) TABS Take 1  tablet by mouth at bedtime.       . ranitidine (ZANTAC) 75 MG tablet Take 75 mg by mouth 2 (two) times daily. For heartburn.      . metroNIDAZOLE (FLAGYL) 500 MG tablet Take 1 tablet (500 mg total) by mouth 2 (two) times daily.  14 tablet  0    ROS As listed in HPI  Physical Exam   Blood pressure 111/69, pulse 105, temperature 98.7 F (37.1 C), temperature source Oral, resp. rate 16, SpO2 100.00%.  Physical Exam  Constitutional: She is oriented to person, place, and time. She appears well-developed and well-nourished. No distress.  Cardiovascular: Normal rate.   Respiratory: Effort normal.  GI: Soft. She exhibits no distension and no mass. There is no tenderness. There is no rebound and no guarding.  Genitourinary: Uterus normal. Vaginal discharge (Small amount creamy white discharge, no pooling, no ferning) found.  Musculoskeletal: Normal range of motion.  Neurological: She is alert and oriented to person, place, and time.  Skin: Skin is warm and dry.  Psychiatric: She has a normal mood and affect.   Dilation: 1 Effacement (%): 60 Station: -1 Presentation: Vertex Exam  by:: Artelia Laroche CNM Sterile Speculum Exam:  No pooling, no Ferning.  Small amount of thick white discharge FHR reactive Occasional contractions with uterine irritability between Last  Took Procardia at 10 am.. Has not eaten since 10 am  MAU Course  Procedures  MDM Discussed with Dr Despina Arias d/c home.   Assessment and Plan  A:  SIUP at [redacted]w[redacted]d       No evidence of ROM      Uterine irritability P:  Discharge home'       PTL precautions       Continue Procardia       Followup in office, call MD if further questions.   Wynelle Bourgeois 10/27/2011, 3:08 PM

## 2011-10-28 ENCOUNTER — Encounter (HOSPITAL_COMMUNITY): Payer: Self-pay

## 2011-10-28 ENCOUNTER — Inpatient Hospital Stay (HOSPITAL_COMMUNITY)
Admission: AD | Admit: 2011-10-28 | Discharge: 2011-10-29 | Disposition: A | Discharge status: Home / Self Care | Payer: Commercial Managed Care - PPO | Source: Ambulatory Visit | Attending: Obstetrics and Gynecology | Admitting: Obstetrics and Gynecology

## 2011-10-28 DIAGNOSIS — O99891 Other specified diseases and conditions complicating pregnancy: Secondary | ICD-10-CM | POA: Insufficient documentation

## 2011-10-28 DIAGNOSIS — N949 Unspecified condition associated with female genital organs and menstrual cycle: Secondary | ICD-10-CM | POA: Insufficient documentation

## 2011-10-28 DIAGNOSIS — O26899 Other specified pregnancy related conditions, unspecified trimester: Secondary | ICD-10-CM

## 2011-10-28 LAB — URINALYSIS, ROUTINE W REFLEX MICROSCOPIC
Glucose, UA: NEGATIVE mg/dL
Leukocytes, UA: NEGATIVE
Specific Gravity, Urine: <1.005 — ABNORMAL LOW (ref 1.005–1.030)
Urobilinogen, UA: 0.2 mg/dL (ref 0.0–1.0)

## 2011-10-28 LAB — URINE MICROSCOPIC-ADD ON

## 2011-10-28 NOTE — MAU Note (Signed)
Pt states, " I've had low abdominal cramping since late yesterday evening and it feels like my pelvic bones are breaking. I noticed a white discharge today with labial swelling."

## 2011-10-29 DIAGNOSIS — N949 Unspecified condition associated with female genital organs and menstrual cycle: Secondary | ICD-10-CM

## 2011-10-29 DIAGNOSIS — O9989 Other specified diseases and conditions complicating pregnancy, childbirth and the puerperium: Secondary | ICD-10-CM

## 2011-10-29 NOTE — MAU Provider Note (Signed)
Chief Complaint:  Pelvic Pain   First Provider Initiated Contact with Patient 10/28/11 2354    HPI  Melissa Ware is a 21 y.o. G1P0 at [redacted]w[redacted]d presenting with sharp pain at symphysis pubis and swelling of mons pubis today. She also reports continuation of mild, irreg contractions and small amount of thin, white discharge for which she was evaluated yesterday--no change. She denies leakage of fluid or vaginal bleeding. Good fetal movement. Last cervical exam 2/60 per pt. She is of bedrest and Procardia for PTL.  Past Medical History: Past Medical History  Diagnosis Date  . Anxiety     Not on meds  . Panic attacks     Not on meds    Past Surgical History: Past Surgical History  Procedure Date  . Appendectomy 2007  . Cholecystectomy, laparoscopic 2007    Family History: Family History  Problem Relation Age of Onset  . Anesthesia problems Other     "allergic to anesthesia"  . Other Neg Hx   . Hypertension Mother   . Hypertension Maternal Grandmother   . Diabetes Maternal Grandfather     Social History: History  Substance Use Topics  . Smoking status: Former Smoker -- 0.5 packs/day for .5 years    Types: Cigarettes    Quit date: 03/30/2011  . Smokeless tobacco: Not on file  . Alcohol Use: No    Allergies:  Allergies  Allergen Reactions  . Onion Anaphylaxis and Swelling    Tongue swells and it becomes hard to breathe  . Peanut-Containing Drug Products Swelling    Tongue swells and it becomes hard to breathe    Meds:  Procardia  Physical Exam  Blood pressure 114/63, temperature 99.1 F (37.3 C), temperature source Oral, resp. rate 16, height 4' 11.5" (1.511 m), weight 52.617 kg (116 lb). GENERAL: Well-developed, well-nourished female in no acute distress.  HEENT: normocephalic, good dentition HEART: normal rate RESP: normal effort ABDOMEN: Soft, nontender, gravid appropriate for gestational age. Moderate tenderness of SP. No swelling seen.  EXTREMITIES:  Nontender, no edema NEURO: alert and oriented Cervix 1.5/60/-3, posterior, moderate consistency, Vtx. Small amount of physiologic discharge. Membranes felt.   FHT:  Baseline 140 , moderate variability, accelerations present, no decelerations Contractions: irreg w/ UI   Labs: Results for orders placed during the hospital encounter of 10/28/11 (from the past 24 hour(s))  URINALYSIS, ROUTINE W REFLEX MICROSCOPIC     Status: Abnormal   Collection Time   10/28/11 11:05 PM      Component Value Range   Color, Urine YELLOW  YELLOW   APPearance CLEAR  CLEAR   Specific Gravity, Urine <1.005 (*) 1.005 - 1.030   pH 6.0  5.0 - 8.0   Glucose, UA NEGATIVE  NEGATIVE mg/dL   Hgb urine dipstick SMALL (*) NEGATIVE   Bilirubin Urine NEGATIVE  NEGATIVE   Ketones, ur NEGATIVE  NEGATIVE mg/dL   Protein, ur NEGATIVE  NEGATIVE mg/dL   Urobilinogen, UA 0.2  0.0 - 1.0 mg/dL   Nitrite NEGATIVE  NEGATIVE   Leukocytes, UA NEGATIVE  NEGATIVE  URINE MICROSCOPIC-ADD ON     Status: Abnormal   Collection Time   10/28/11 11:05 PM      Component Value Range   Squamous Epithelial / LPF FEW (*) RARE   WBC, UA 3-6  <3 WBC/hpf   RBC / HPF 0-2  <3 RBC/hpf   Bacteria, UA FEW (*) RARE   Urine-Other MUCOUS PRESENT     Imaging:  NA  Assessment:  1. Pregnancy related symphysis pain, antepartum    Plan: D/C home per consult w/ Dr. Vincente Poli PTL precautions and [facility]'s Comfort measures Follow-up Information    Follow up with GREWAL,MICHELLE L, MD. (as schsduled or MAU as needed if symptoms worsen)    Contact information:   [address] [place] [address] [phone]         Medication List  As of 10/29/2011  1:41 AM   CONTINUE taking these medications         acetaminophen 325 MG tablet   Commonly known as: TYLENOL      calcium carbonate 500 MG chewable tablet   Commonly known as: TUMS - dosed in mg elemental calcium      metroNIDAZOLE 500 MG tablet   Commonly known as: FLAGYL    Take 1 tablet (500 mg total) by mouth 2 (two) times daily.      NIFEdipine 10 MG capsule   Commonly known as: PROCARDIA   Take 1 capsule (10 mg total) by mouth 3 (three) times daily.      prenatal multivitamin Tabs      ranitidine 75 MG tablet   Commonly known as: [employer], Melissa Ware 10/29/2011 1:34 AM

## 2011-11-10 LAB — OB RESULTS CONSOLE GBS: GBS: NEGATIVE

## 2011-11-17 ENCOUNTER — Inpatient Hospital Stay (HOSPITAL_COMMUNITY)
Admission: AD | Admit: 2011-11-17 | Discharge: 2011-11-17 | Disposition: A | Discharge status: Home / Self Care | Payer: Commercial Managed Care - PPO | Source: Ambulatory Visit | Attending: Obstetrics and Gynecology | Admitting: Obstetrics and Gynecology

## 2011-11-17 ENCOUNTER — Encounter (HOSPITAL_COMMUNITY): Payer: Self-pay | Admitting: *Deleted

## 2011-11-17 DIAGNOSIS — O479 False labor, unspecified: Secondary | ICD-10-CM | POA: Insufficient documentation

## 2011-11-17 LAB — URINALYSIS, ROUTINE W REFLEX MICROSCOPIC
Bilirubin Urine: NEGATIVE
Glucose, UA: NEGATIVE mg/dL
Ketones, ur: NEGATIVE mg/dL
Nitrite: NEGATIVE
pH: 6 (ref 5.0–8.0)

## 2011-11-17 LAB — URINE MICROSCOPIC-ADD ON

## 2011-11-17 NOTE — MAU Note (Signed)
Pt reports UC's q 4 min

## 2011-11-21 ENCOUNTER — Encounter (HOSPITAL_COMMUNITY): Payer: Self-pay | Admitting: *Deleted

## 2011-11-21 ENCOUNTER — Telehealth (HOSPITAL_COMMUNITY): Payer: Self-pay | Admitting: *Deleted

## 2011-11-21 NOTE — Telephone Encounter (Signed)
Preadmission screen  

## 2011-11-22 ENCOUNTER — Telehealth (HOSPITAL_COMMUNITY): Payer: Self-pay | Admitting: *Deleted

## 2011-11-22 ENCOUNTER — Encounter (HOSPITAL_COMMUNITY): Payer: Self-pay | Admitting: *Deleted

## 2011-11-22 NOTE — Telephone Encounter (Signed)
Preadmission screen  

## 2011-11-29 ENCOUNTER — Inpatient Hospital Stay (HOSPITAL_COMMUNITY): Payer: Commercial Managed Care - PPO | Admitting: Anesthesiology

## 2011-11-29 ENCOUNTER — Encounter (HOSPITAL_COMMUNITY): Payer: Self-pay

## 2011-11-29 ENCOUNTER — Encounter (HOSPITAL_COMMUNITY): Payer: Self-pay | Admitting: Anesthesiology

## 2011-11-29 ENCOUNTER — Inpatient Hospital Stay (HOSPITAL_COMMUNITY)
Admission: RE | Admit: 2011-11-29 | Discharge: 2011-12-01 | DRG: 775 | Disposition: A | Discharge status: Home / Self Care | Payer: Commercial Managed Care - PPO | Source: Ambulatory Visit | Attending: Obstetrics and Gynecology | Admitting: Obstetrics and Gynecology

## 2011-11-29 LAB — CBC
HCT: 35.4 % — ABNORMAL LOW (ref 36.0–46.0)
MCH: 32.7 pg (ref 26.0–34.0)
MCV: 95.7 fL (ref 78.0–100.0)
Platelets: 274 10*3/uL (ref 150–400)
RDW: 13.1 % (ref 11.5–15.5)

## 2011-11-29 MED ORDER — IBUPROFEN 600 MG PO TABS
600.0000 mg | ORAL_TABLET | Freq: Four times a day (QID) | ORAL | Status: DC
  Administered 2011-11-29 – 2011-12-01 (×6): 600 mg via ORAL
  Filled 2011-11-29 (×8): qty 1

## 2011-11-29 MED ORDER — ONDANSETRON HCL 4 MG/2ML IJ SOLN: 4.0000 mg | INTRAMUSCULAR | Status: DC | PRN

## 2011-11-29 MED ORDER — OXYTOCIN BOLUS FROM INFUSION
250.0000 mL | Freq: Once | INTRAVENOUS | Status: DC
  Filled 2011-11-29: qty 500

## 2011-11-29 MED ORDER — ONDANSETRON HCL 4 MG PO TABS: 4.0000 mg | ORAL_TABLET | ORAL | Status: DC | PRN

## 2011-11-29 MED ORDER — FENTANYL 2.5 MCG/ML BUPIVACAINE 1/10 % EPIDURAL INFUSION (WH - ANES)
INTRAMUSCULAR | Status: DC | PRN
  Administered 2011-11-29: 12 mL/h via EPIDURAL

## 2011-11-29 MED ORDER — LIDOCAINE HCL (PF) 1 % IJ SOLN
INTRAMUSCULAR | Status: DC | PRN
  Administered 2011-11-29: 4 mL
  Administered 2011-11-29: 3 mL

## 2011-11-29 MED ORDER — PHENYLEPHRINE 40 MCG/ML (10ML) SYRINGE FOR IV PUSH (FOR BLOOD PRESSURE SUPPORT)
80.0000 ug | PREFILLED_SYRINGE | INTRAVENOUS | Status: DC | PRN
  Filled 2011-11-29: qty 5

## 2011-11-29 MED ORDER — ACETAMINOPHEN 325 MG PO TABS: 650.0000 mg | ORAL_TABLET | ORAL | Status: DC | PRN

## 2011-11-29 MED ORDER — FENTANYL 2.5 MCG/ML BUPIVACAINE 1/10 % EPIDURAL INFUSION (WH - ANES)
14.0000 mL/h | INTRAMUSCULAR | Status: DC
  Administered 2011-11-29: 14 mL/h via EPIDURAL
  Filled 2011-11-29 (×2): qty 60

## 2011-11-29 MED ORDER — ZOLPIDEM TARTRATE 5 MG PO TABS: 5.0000 mg | ORAL_TABLET | Freq: Every evening | ORAL | Status: DC | PRN

## 2011-11-29 MED ORDER — OXYCODONE-ACETAMINOPHEN 5-325 MG PO TABS: 1.0000 | ORAL_TABLET | ORAL | Status: DC | PRN

## 2011-11-29 MED ORDER — SIMETHICONE 80 MG PO CHEW: 80.0000 mg | CHEWABLE_TABLET | ORAL | Status: DC | PRN

## 2011-11-29 MED ORDER — TETANUS-DIPHTH-ACELL PERTUSSIS 5-2.5-18.5 LF-MCG/0.5 IM SUSP
0.5000 mL | Freq: Once | INTRAMUSCULAR | Status: DC
  Filled 2011-11-29: qty 0.5

## 2011-11-29 MED ORDER — EPHEDRINE 5 MG/ML INJ: 10.0000 mg | INTRAVENOUS | Status: DC | PRN

## 2011-11-29 MED ORDER — OXYCODONE-ACETAMINOPHEN 5-325 MG PO TABS
1.0000 | ORAL_TABLET | ORAL | Status: DC | PRN
  Administered 2011-11-29: 2 via ORAL
  Administered 2011-11-30: 1 via ORAL
  Filled 2011-11-29: qty 2
  Filled 2011-11-29: qty 1

## 2011-11-29 MED ORDER — BENZOCAINE-MENTHOL 20-0.5 % EX AERO
1.0000 "application " | INHALATION_SPRAY | CUTANEOUS | Status: DC | PRN
  Administered 2011-11-29: 1 via TOPICAL
  Filled 2011-11-29: qty 56

## 2011-11-29 MED ORDER — LACTATED RINGERS IV SOLN
500.0000 mL | INTRAVENOUS | Status: DC | PRN
Start: 2011-11-29 — End: 2011-11-29

## 2011-11-29 MED ORDER — SENNOSIDES-DOCUSATE SODIUM 8.6-50 MG PO TABS
2.0000 | ORAL_TABLET | Freq: Every day | ORAL | Status: DC
  Administered 2011-11-29: 2 via ORAL

## 2011-11-29 MED ORDER — LACTATED RINGERS IV SOLN: 500.0000 mL | Freq: Once | INTRAVENOUS | Status: DC

## 2011-11-29 MED ORDER — IBUPROFEN 600 MG PO TABS: 600.0000 mg | ORAL_TABLET | Freq: Four times a day (QID) | ORAL | Status: DC | PRN

## 2011-11-29 MED ORDER — ONDANSETRON HCL 4 MG/2ML IJ SOLN: 4.0000 mg | Freq: Four times a day (QID) | INTRAMUSCULAR | Status: DC | PRN

## 2011-11-29 MED ORDER — DIPHENHYDRAMINE HCL 50 MG/ML IJ SOLN: 12.5000 mg | INTRAMUSCULAR | Status: DC | PRN

## 2011-11-29 MED ORDER — EPHEDRINE 5 MG/ML INJ
10.0000 mg | INTRAVENOUS | Status: DC | PRN
  Filled 2011-11-29: qty 4

## 2011-11-29 MED ORDER — LIDOCAINE HCL (PF) 1 % IJ SOLN
30.0000 mL | INTRAMUSCULAR | Status: DC | PRN
  Filled 2011-11-29: qty 30

## 2011-11-29 MED ORDER — PHENYLEPHRINE 40 MCG/ML (10ML) SYRINGE FOR IV PUSH (FOR BLOOD PRESSURE SUPPORT): 80.0000 ug | PREFILLED_SYRINGE | INTRAVENOUS | Status: DC | PRN

## 2011-11-29 MED ORDER — OXYTOCIN 40 UNITS IN LACTATED RINGERS INFUSION - SIMPLE MED
62.5000 mL/h | Freq: Once | INTRAVENOUS | Status: AC
  Administered 2011-11-29: 62.5 mL/h via INTRAVENOUS
  Filled 2011-11-29: qty 1000

## 2011-11-29 MED ORDER — PRENATAL MULTIVITAMIN CH: 1.0000 | ORAL_TABLET | Freq: Every day | ORAL | Status: DC

## 2011-11-29 MED ORDER — BUTORPHANOL TARTRATE 1 MG/ML IJ SOLN: 1.0000 mg | INTRAMUSCULAR | Status: DC | PRN

## 2011-11-29 MED ORDER — LANOLIN HYDROUS EX OINT: TOPICAL_OINTMENT | CUTANEOUS | Status: DC | PRN

## 2011-11-29 MED ORDER — MEASLES, MUMPS & RUBELLA VAC ~~LOC~~ INJ
0.5000 mL | INJECTION | Freq: Once | SUBCUTANEOUS | Status: DC
  Filled 2011-11-29: qty 0.5

## 2011-11-29 MED ORDER — DIPHENHYDRAMINE HCL 25 MG PO CAPS: 25.0000 mg | ORAL_CAPSULE | Freq: Four times a day (QID) | ORAL | Status: DC | PRN

## 2011-11-29 MED ORDER — FLEET ENEMA 7-19 GM/118ML RE ENEM: 1.0000 | ENEMA | RECTAL | Status: DC | PRN

## 2011-11-29 MED ORDER — DIBUCAINE 1 % RE OINT
1.0000 "application " | TOPICAL_OINTMENT | RECTAL | Status: DC | PRN
  Filled 2011-11-29: qty 28

## 2011-11-29 MED ORDER — LACTATED RINGERS IV SOLN
INTRAVENOUS | Status: DC
  Administered 2011-11-29 (×2): via INTRAVENOUS

## 2011-11-29 MED ORDER — CITRIC ACID-SODIUM CITRATE 334-500 MG/5ML PO SOLN: 30.0000 mL | ORAL | Status: DC | PRN

## 2011-11-29 MED ORDER — MEDROXYPROGESTERONE ACETATE 150 MG/ML IM SUSP: 150.0000 mg | INTRAMUSCULAR | Status: DC | PRN

## 2011-11-29 MED ORDER — PRENATAL MULTIVITAMIN CH
1.0000 | ORAL_TABLET | Freq: Every day | ORAL | Status: DC
  Administered 2011-11-29: 1 via ORAL
  Filled 2011-11-29 (×3): qty 1

## 2011-11-29 MED ORDER — WITCH HAZEL-GLYCERIN EX PADS: 1.0000 "application " | MEDICATED_PAD | CUTANEOUS | Status: DC | PRN

## 2011-11-29 NOTE — Progress Notes (Addendum)
[employer] at bedside Req pt stop pushing needs to do another delivery that is coming more quickly. Pt agreeable

## 2011-11-29 NOTE — Anesthesia Procedure Notes (Signed)
Epidural Patient location during procedure: OB Start time: 11/29/2011 8:59 AM  Staffing Anesthesiologist: Onesty Clair A. Performed by: anesthesiologist   Preanesthetic Checklist Completed: patient identified, site marked, surgical consent, pre-op evaluation, timeout performed, IV checked, risks and benefits discussed and monitors and equipment checked  Epidural Patient position: sitting Prep: site prepped and draped and DuraPrep Patient monitoring: continuous pulse ox and blood pressure Approach: midline Injection technique: LOR air  Needle:  Needle type: Tuohy  Needle gauge: 17 G Needle length: 9 cm Needle insertion depth: 4 cm Catheter type: closed end flexible Catheter size: 19 Gauge Catheter at skin depth: 9 cm Test dose: negative and Other  Assessment Events: blood not aspirated, injection not painful, no injection resistance, negative IV test and no paresthesia  Additional Notes Patient identified. Risks and benefits discussed including failed block, incomplete  Pain control, post dural puncture headache, nerve damage, paralysis, blood pressure Changes, nausea, vomiting, reactions to medications-both toxic and allergic and post Partum back pain. All questions were answered. Patient expressed understanding and wished to proceed. Sterile technique was used throughout procedure. Epidural site was Dressed with sterile barrier dressing. No paresthesias, signs of intravascular injection Or signs of intrathecal spread were encountered.  Patient was more comfortable after the epidural was dosed. Please see RN's note for documentation of vital signs and FHR which are stable.

## 2011-11-29 NOTE — Anesthesia Preprocedure Evaluation (Signed)
Anesthesia Evaluation  Patient identified by MRN, date of birth, ID band Patient awake    Reviewed: Allergy & Precautions, H&P , Patient's Chart, lab work & pertinent test results  Airway Mallampati: III TM Distance: >3 FB Neck ROM: full    Dental No notable dental hx. (+) Teeth Intact   Pulmonary neg pulmonary ROS,  breath sounds clear to auscultation  Pulmonary exam normal       Cardiovascular negative cardio ROS  Rhythm:regular Rate:Normal     Neuro/Psych PSYCHIATRIC DISORDERS Anxiety Hx/o Panic attacks negative neurological ROS     GI/Hepatic Neg liver ROS, GERD-  Medicated and Controlled,  Endo/Other  negative endocrine ROS  Renal/GU negative Renal ROS  negative genitourinary   Musculoskeletal negative musculoskeletal ROS (+)   Abdominal Normal abdominal exam  (+)   Peds  Hematology negative hematology ROS (+)   Anesthesia Other Findings   Reproductive/Obstetrics (+) Pregnancy                           Anesthesia Physical Anesthesia Plan  ASA: II  Anesthesia Plan: Epidural   Post-op Pain Management:    Induction:   Airway Management Planned:   Additional Equipment:   Intra-op Plan:   Post-operative Plan:   Informed Consent: I have reviewed the patients History and Physical, chart, labs and discussed the procedure including the risks, benefits and alternatives for the proposed anesthesia with the patient or authorized representative who has indicated his/her understanding and acceptance.     Plan Discussed with: Anesthesiologist  Anesthesia Plan Comments:         Anesthesia Quick Evaluation

## 2011-11-29 NOTE — H&P (Signed)
Melissa Ware is a 21 y.o. female presenting for IOL due to prolonged latent labor and adv cervical dilation and patient request Pregnancy otherwise uncomplicated  GBS -. History OB History    Grav Para Term Preterm Abortions TAB SAB Ect Mult Living   1 0 0 0 0 0 0 0 0 0      Past Medical History  Diagnosis Date  . Anxiety     Not on meds  . Panic attacks     Not on meds  . Abnormal Pap smear   . H/O varicella    Past Surgical History  Procedure Date  . Appendectomy 2007  . Cholecystectomy, laparoscopic 2007  . Gynecologic cryosurgery    Family History: family history includes Anesthesia problems in her other; Diabetes in her maternal grandfather; Heart disease in her father; and Hypertension in her maternal grandmother and mother.  There is no history of Other. Social History:  reports that she quit smoking about 8 months ago. Her smoking use included Cigarettes. She has a .25 pack-year smoking history. She does not have any smokeless tobacco history on file. She reports that she does not drink alcohol or use illicit drugs.   Prenatal Transfer Tool  Maternal Diabetes: No Genetic Screening: Normal Maternal Ultrasounds/Referrals: Normal Fetal Ultrasounds or other Referrals:  None Maternal Substance Abuse:  No Significant Maternal Medications:  None Significant Maternal Lab Results:  None Other Comments:  None  ROS  Dilation: 4.5 Effacement (%): 70 Station: -1 Exam by:: Izayah Miner Blood pressure 122/90, pulse 85, temperature 98.2 F (36.8 C), temperature source Oral, height 4\' 11"  (1.499 m), weight 55.339 kg (122 lb). Exam Physical Exam  Prenatal labs: ABO, Rh: A/Positive/-- (01/08 0000) Antibody: Negative (01/08 0000) Rubella: Immune (01/08 0000) RPR: Nonreactive (01/08 0000)  HBsAg: Negative (01/08 0000)  HIV: Non-reactive (01/08 0000)  GBS: Negative (07/19 0000)   Assessment/Plan: IUP at term with prolonged latent labor and adv dilation for arom Anticipate  svd   Caro Brundidge C 11/29/2011, 8:34 AM

## 2011-11-29 NOTE — Progress Notes (Addendum)
Lowe at bedside we will cont to wait on pushing.

## 2011-11-30 LAB — CBC
MCH: 33.2 pg (ref 26.0–34.0)
MCHC: 34.5 g/dL (ref 30.0–36.0)
Platelets: 201 10*3/uL (ref 150–400)
RBC: 2.74 MIL/uL — ABNORMAL LOW (ref 3.87–5.11)
RDW: 13.4 % (ref 11.5–15.5)

## 2011-11-30 MED ORDER — ALPRAZOLAM 0.25 MG PO TABS
0.2500 mg | ORAL_TABLET | Freq: Three times a day (TID) | ORAL | Status: DC | PRN
  Administered 2011-11-30: 0.25 mg via ORAL
  Filled 2011-11-30: qty 1
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
## 2011-11-30 NOTE — Anesthesia Postprocedure Evaluation (Signed)
  Anesthesia Post Note  Patient: Melissa Ware  Procedure(s) Performed: * No procedures listed *  Anesthesia type: Epidural  Patient location: Mother/Baby  Post pain: Pain level controlled  Post assessment: Post-op Vital signs reviewed  Last Vitals:  Filed Vitals:   11/30/11 0540  BP: 104/62  Pulse: 106  Temp: 36.8 C  Resp: 18    Post vital signs: Reviewed  Level of consciousness:alert  Complications: No apparent anesthesia complications

## 2011-11-30 NOTE — Progress Notes (Signed)
Patient expressed feeling anxious after visiting baby in NICU and stated that she has a history of anxiety with panic attacks and asked to take medication to be able to relax and sleep. Called Dr. Rana Snare, order received. Will continue to monitor patient.

## 2011-11-30 NOTE — Progress Notes (Signed)
Post Partum Day 1 Subjective: no complaints, up ad lib, voiding, tolerating PO and baby in NICU for low blood sugar. mother wthout GDM  Objective: Blood pressure 104/62, pulse 106, temperature 98.2 F (36.8 C), temperature source Oral, resp. rate 18, height 4\' 11"  (1.499 m), weight 55.339 kg (122 lb), SpO2 99.00%, unknown if currently breastfeeding.  Physical Exam:  General: alert and cooperative Lochia: appropriate Uterine Fundus: firm Incision: perineum intact DVT Evaluation: No evidence of DVT seen on physical exam.   Basename 11/30/11 0525 11/29/11 0750  HGB 9.1* 12.1  HCT 26.4* 35.4*    Assessment/Plan: Plan for discharge tomorrow   LOS: 1 day   Shadae Reino G 11/30/2011, 8:05 AM

## 2011-11-30 NOTE — Progress Notes (Signed)
Patient requests that father of baby be asked to leave the mother's room without father knowing patient request of nurse. Mother of patient relayed the message to nurse. Father was asked to leave for the night since he did not have bracelets on and visiting hours were long over. Father was agreeable and did leave. Mother did appear less anxious/able to rest and then sleep after he left and after administration of antianxiety medication.

## 2011-12-01 MED ORDER — IBUPROFEN 600 MG PO TABS: 600.0000 mg | ORAL_TABLET | Freq: Four times a day (QID) | ORAL | Status: AC

## 2011-12-01 NOTE — Discharge Summary (Signed)
Obstetric Discharge Summary Reason for Admission: induction of labor Prenatal Procedures: ultrasound Intrapartum Procedures: spontaneous vaginal delivery Postpartum Procedures: none Complications-Operative and Postpartum: 2 degree perineal laceration Hemoglobin  Date Value Range Status  11/30/2011 9.1* 12.0 - 15.0 Ware/dL Final     REPEATED TO VERIFY     DELTA CHECK NOTED     HCT  Date Value Range Status  11/30/2011 26.4* 36.0 - 46.0 % Final    Physical Exam:  General: alert and cooperative Lochia: appropriate Uterine Fundus: firm Incision: perineum intact DVT Evaluation: No evidence of DVT seen on physical exam.  Discharge Diagnoses: Term Pregnancy-delivered  Discharge Information: Date: 12/01/2011 Activity: pelvic rest Diet: routine Medications: PNV and Ibuprofen Condition: stable Instructions: refer to practice specific booklet Discharge to: home   Newborn Data: Live born female  Birth Weight: 5 lb 13.8 oz (2660 Ware) APGAR: 8, 9  Home with in NUICU.  Melissa Ware 12/01/2011, 8:03 AM

## 2011-12-01 NOTE — Clinical Social Work Maternal (Signed)
Clinical Social Work Department PSYCHOSOCIAL ASSESSMENT - MATERNAL/CHILD 12/01/2011  Patient:  NEHA, WAIGHT  Account Number:  [id number]  Admit Date:  11/29/2011  Marjo Bicker Name:   Reggy Eye    Clinical Social Worker:  Lulu Riding, LCSW   Date/Time:  12/01/2011 12:30 PM  Date Referred:  12/01/2011   Referral source  NICU     Referred reason  NICU   Other referral source:    I:  FAMILY / HOME ENVIRONMENT Child's legal guardian:  PARENT  Guardian - Name Guardian - Age Guardian - Address  Kessie Nance 20 [address] [place], [place] [address]  Gala Lewandowsky 25 [place]   Other household support members/support persons Name Relationship DOB   GRANDFATHER    Other support:   MGM-Renita Boothe, friends, family    II  PSYCHOSOCIAL DATA Information Source:  Patient Interview  Event organiser Employment:   Surveyor, quantity resources:  Media planner If [employer] - County:  [place]  School / Grade:   Maternity Care Coordinator / Child Services Coordination / Early Interventions:  Cultural issues impacting care:   none known    III  STRENGTHS Strengths  Adequate Resources  Compliance with medical plan  Home prepared for Child (including basic supplies)  Other - See comment  Supportive family/friends  Understanding of illness   Strength comment:  baby will have follow up with Dr. Orvan Falconer   IV  RISK FACTORS AND CURRENT PROBLEMS Current Problem:  YES   Risk Factor & Current Problem Patient Issue Family Issue Risk Factor / Current Problem Comment  Family/Relationship Issues Y N MOB and FOB are not together and not getting along    V  SOCIAL WORK ASSESSMENT SW met with MOB at bedsideto schedule a time to meet today. There were questions about visitation by the bedside RNs. MOB states she was told last night that if FOB had to be listed on the visitation sheet as the significant other and that staff could not prevent him from visiting or  limit his visits.  SW explained that this is not accurate information and since he is not on the birth certificate, he does not have parental rights to the child.  Therefore, MOB can made all decisions including when FOB visits.  RN stated that no one knew this and SW informed her that it was documented yesterday after SW's initial meeting with MOB.  SW later met with MOB in her first floor room/111 to follow up on conversation from yesterday.  MOB was upbeat and states that she and baby are doing well.  SW apologized for the confusion last night and MOB was accepting of this.  She states that FOB visited this morning and that they were at the bedside together and everything went well.  Parents are not together and MOB states that there are many issues, but they finally broke up because he is not a [place] and raising her son in church is important to her.  SW commended her for sticking to her beliefs and used a strength based approach while counseling with her.  She also states that he is on Methadone for a pain pill addiction and that he does not seem motivated to come off.  She states she is not fearful of how it will be once she gets home with baby and states that she lives with her father who is supportive and knows the situation.  SW informed her of her parental rights and what will  happen if/when FOB establishes paternity and adds his name to the birth certificate.  She is understanding.  She asked for resources in finding a Clinical research associate.  SW gave her contact numbers to Legal Aid and [employer].  SW asked if MOB plans to start back on Xanax regularly and she states that with FOB out of the picture, she does not think she will have to take it regularly, but has it filled and can take it PRN.  SW encouraged MOB to start outpatient counseling due to what she has been through with FOB.  She states that her mother has encouraged her as well.  She is not interested at this time, but was open to the  conversation and agreed to call SW if she is interested in the future and needs more information.  SW gave "Feelings After Birth" handout and discussed signs and symptoms of PPD and common emotions related to the NICU experience. She was open to this and feels comfortable talking with her doctor if symptoms arise.  She seemed appreciative of SW's concern for her.  She reports having everything she needs for baby at home and no further questions or concerns at this time.  SW gave contact information and will continue to provide support and assistance as needed.      VI SOCIAL WORK PLAN Social Work Plan  Psychosocial Support/Ongoing Assessment of Needs   Type of pt/family education:   PPD  benefits of counseling   If child protective services report - county:   If child protective services report - date:   Information/referral to community resources comment:   Feelings After Birth support group information.   Other social work plan:

## 2013-06-05 LAB — OB RESULTS CONSOLE ABO/RH: RH Type: POSITIVE

## 2013-06-05 LAB — OB RESULTS CONSOLE GC/CHLAMYDIA
CHLAMYDIA, DNA PROBE: NEGATIVE
GC PROBE AMP, GENITAL: NEGATIVE

## 2013-06-05 LAB — OB RESULTS CONSOLE ANTIBODY SCREEN: ANTIBODY SCREEN: NEGATIVE

## 2013-06-05 LAB — OB RESULTS CONSOLE RPR: RPR: NONREACTIVE

## 2013-06-05 LAB — OB RESULTS CONSOLE RUBELLA ANTIBODY, IGM: Rubella: IMMUNE

## 2013-06-05 LAB — OB RESULTS CONSOLE HIV ANTIBODY (ROUTINE TESTING): HIV: NONREACTIVE

## 2013-06-05 LAB — OB RESULTS CONSOLE HEPATITIS B SURFACE ANTIGEN: HEP B S AG: NEGATIVE

## 2013-09-06 ENCOUNTER — Inpatient Hospital Stay (HOSPITAL_COMMUNITY): Payer: BC Managed Care – PPO

## 2013-09-06 ENCOUNTER — Encounter (HOSPITAL_COMMUNITY): Payer: Self-pay | Admitting: *Deleted

## 2013-09-06 ENCOUNTER — Inpatient Hospital Stay (HOSPITAL_COMMUNITY)
Admission: AD | Admit: 2013-09-06 | Discharge: 2013-09-06 | Disposition: A | Discharge status: Home / Self Care | Payer: BC Managed Care – PPO | Source: Ambulatory Visit | Attending: Obstetrics and Gynecology | Admitting: Obstetrics and Gynecology

## 2013-09-06 DIAGNOSIS — N949 Unspecified condition associated with female genital organs and menstrual cycle: Secondary | ICD-10-CM

## 2013-09-06 DIAGNOSIS — O9989 Other specified diseases and conditions complicating pregnancy, childbirth and the puerperium: Secondary | ICD-10-CM

## 2013-09-06 DIAGNOSIS — O479 False labor, unspecified: Secondary | ICD-10-CM

## 2013-09-06 DIAGNOSIS — R102 Pelvic and perineal pain: Secondary | ICD-10-CM

## 2013-09-06 DIAGNOSIS — O4700 False labor before 37 completed weeks of gestation, unspecified trimester: Secondary | ICD-10-CM

## 2013-09-06 DIAGNOSIS — Z87891 Personal history of nicotine dependence: Secondary | ICD-10-CM | POA: Insufficient documentation

## 2013-09-06 DIAGNOSIS — O200 Threatened abortion: Secondary | ICD-10-CM | POA: Diagnosis not present

## 2013-09-06 DIAGNOSIS — R109 Unspecified abdominal pain: Secondary | ICD-10-CM | POA: Diagnosis present

## 2013-09-06 DIAGNOSIS — O26899 Other specified pregnancy related conditions, unspecified trimester: Secondary | ICD-10-CM

## 2013-09-06 LAB — URINALYSIS, ROUTINE W REFLEX MICROSCOPIC
Bilirubin Urine: NEGATIVE
Glucose, UA: NEGATIVE mg/dL
Ketones, ur: NEGATIVE mg/dL
LEUKOCYTES UA: NEGATIVE
Nitrite: NEGATIVE
Protein, ur: NEGATIVE mg/dL
SPECIFIC GRAVITY, URINE: 1.02 (ref 1.005–1.030)
UROBILINOGEN UA: 0.2 mg/dL (ref 0.0–1.0)
pH: 6.5 (ref 5.0–8.0)

## 2013-09-06 LAB — URINE MICROSCOPIC-ADD ON

## 2013-09-06 MED ORDER — NIFEDIPINE 10 MG PO CAPS: 10.0000 mg | ORAL_CAPSULE | Freq: Four times a day (QID) | ORAL | Status: DC

## 2013-09-06 MED ORDER — NIFEDIPINE 10 MG PO CAPS
10.0000 mg | ORAL_CAPSULE | Freq: Once | ORAL | Status: AC
  Administered 2013-09-06: 10 mg via ORAL
  Filled 2013-09-06: qty 1

## 2013-09-06 MED ORDER — NIFEDIPINE 10 MG PO CAPS
10.0000 mg | ORAL_CAPSULE | Freq: Four times a day (QID) | ORAL | Status: DC
Start: 2013-09-06 — End: 2013-09-06

## 2013-09-06 NOTE — MAU Note (Signed)
Pt. Here due to lower abdominal pain that started yesterday. Pain got better last night but was still there and this am pain was bad again. Pt. Feels that when she sits up pain is worse. Rates pain 7/10. Denies any bleeding or discharge. Nausea noted. Denies vomiting or diarrhea.

## 2013-09-06 NOTE — MAU Provider Note (Signed)
Chief Complaint: Abdominal Pain   First Provider Initiated Contact with Patient 09/06/13 1438     SUBJECTIVE HPI: Melissa Ware is a 23 y.o. G2P1001 at  who presents with 2 day hx of crampy lower abdominal pain radiating to upper abdomen or to low back. Constant but waxes and wanes. Worse with prolonged sitting, position changes and walking. Also feels pelvic pressure and bladder pressure. Has not treated the pain. No antecedent lifting or strenuous activity.  Denies dysuria, hematuria, frequency or urgency of urination. Denies irritative vaginal discharge. No N/V/D/C. Good fetal movement. No upper abdominal pain or contractions. No vaginal bleeding or leakage of fluid.   PNC: essentially uncomplicated, significant for hx PT contractions on Procrdia with FT SVD; hx cyro. States US at 18 wks normal.  Past Medical History  Diagnosis Date  . Anxiety     Not on meds  . Panic attacks     Not on meds  . Abnormal Pap smear   . H/O varicella    OB History  Gravida Para Term Preterm AB SAB TAB Ectopic Multiple Living  1 1 1  0 0 0 0 0 0 1    # Outcome Date GA Lbr Len/2nd Weight Sex Delivery Anes PTL Lv  1 TRM 11/29/11 [id number] 03:16 / 01:24 2.66 kg (5 lb 13.8 oz) M SVD EPI  Y     Comments: None     Past Surgical History  Procedure Laterality Date  . Appendectomy  2007  . Cholecystectomy, laparoscopic  2007  . Gynecologic cryosurgery     History   Social History  . Marital Status: Married    Spouse Name: N/A    Number of Children: N/A  . Years of Education: N/A   Occupational History  . Not on file.   Social History Main Topics  . Smoking status: Former Smoker -- 0.50 packs/day for .5 years    Types: Cigarettes    Quit date: 03/30/2011  . Smokeless tobacco: Not on file  . Alcohol Use: No  . Drug Use: No  . Sexual Activity: No   Other Topics Concern  . Not on file   Social History Narrative  . No narrative on file   No current facility-administered medications on file  prior to encounter.   Current Outpatient Prescriptions on File Prior to Encounter  Medication Sig Dispense Refill  . Prenatal Vit-Fe Fumarate-FA (PRENATAL MULTIVITAMIN) TABS Take 1 tablet by mouth at bedtime.        Allergies  Allergen Reactions  . Onion Anaphylaxis and Swelling    Tongue swells and it becomes hard to breathe  . Peanut-Containing Drug Products Swelling    Tongue swells and it becomes hard to breathe     ROS: Pertinent items in HPI  OBJECTIVE Blood pressure 123/71, pulse 92, temperature 98.8 F (37.1 C), temperature source Oral, resp. rate 18, height 4\' 11"  (1.499 m), weight 43.092 kg (95 lb), unknown if currently breastfeeding. GENERAL: Well-developed, well-nourished female in no acute distress.  HEENT: Normocephalic HEART: normal rate RESP: normal effort ABDOMEN: Soft, non-tender. FHR 150 BACK: neg CVAT or paraspinous TTP EXTREMITIES: Nontender, no edema NEURO: Alert and oriented VE: closed/ 30/-2 EFM:  LAB RESULTS Results for orders placed during the hospital encounter of 09/06/13 (from the past 24 hour(s))  URINALYSIS, ROUTINE W REFLEX MICROSCOPIC     Status: Abnormal   Collection Time    09/06/13  2:15 PM      Result Value Ref Range   Color, Urine  YELLOW  YELLOW   APPearance CLEAR  CLEAR   Specific Gravity, Urine 1.020  1.005 - 1.030   pH 6.5  5.0 - 8.0   Glucose, UA NEGATIVE  NEGATIVE mg/dL   Hgb urine dipstick SMALL (*) NEGATIVE   Bilirubin Urine NEGATIVE  NEGATIVE   Ketones, ur NEGATIVE  NEGATIVE mg/dL   Protein, ur NEGATIVE  NEGATIVE mg/dL   Urobilinogen, UA 0.2  0.0 - 1.0 mg/dL   Nitrite NEGATIVE  NEGATIVE   Leukocytes, UA NEGATIVE  NEGATIVE  URINE MICROSCOPIC-ADD ON     Status: Abnormal   Collection Time    09/06/13  2:15 PM      Result Value Ref Range   Squamous Epithelial / LPF FEW (*) RARE   RBC / HPF 3-6  <3 RBC/hpf   Bacteria, UA RARE  RARE   Urine-Other MUCOUS PRESENT      IMAGING     MAU COURSE C/W Dr. Vincente PoliGrewal who  reviewed EFM> Procardia 10 mg po now  ASSESSMENT 1. Pain of round ligament complicating pregnancy, antepartum   2. Preterm uterine contractions   G2P1001 at 471w5d  PLAN Discharge home Pelvic rest   Medication List         NIFEdipine 10 MG capsule  Commonly known as:  PROCARDIA  Take 1 capsule (10 mg total) by mouth every 6 (six) hours.     prenatal multivitamin Tabs tablet  Take 1 tablet by mouth at bedtime.        Follow-up Information   Follow up with Jeani HawkingGREWAL,MICHELLE L, MD On 09/09/2013.   Specialty:  Obstetrics and Gynecology   Contact information:   [address] [place] [id number] [phone]      Melissa Ware, CNM 09/06/2013  2:38 PM

## 2013-09-06 NOTE — MAU Note (Signed)
Pt presents to MAU with complaints of lower abdominal pain and lower back pain since last night. Denies any vaginal bleeding or leakage of fluid.

## 2013-09-06 NOTE — Discharge Instructions (Signed)
Preterm Labor Information Preterm labor is when labor starts at less than 37 weeks of pregnancy. The normal length of a pregnancy is 39 to 41 weeks. CAUSES Often, there is no identifiable underlying cause as to why a woman goes into preterm labor. One of the most common known causes of preterm labor is infection. Infections of the uterus, cervix, vagina, amniotic sac, bladder, kidney, or even the lungs (pneumonia) can cause labor to start. Other suspected causes of preterm labor include:   Urogenital infections, such as yeast infections and bacterial vaginosis.   Uterine abnormalities (uterine shape, uterine septum, fibroids, or bleeding from the placenta).   A cervix that has been operated on (it may fail to stay closed).   Malformations in the fetus.   Multiple gestations (twins, triplets, and so on).   Breakage of the amniotic sac.  RISK FACTORS  Having a previous history of preterm labor.   Having premature rupture of membranes (PROM).   Having a placenta that covers the opening of the cervix (placenta previa).   Having a placenta that separates from the uterus (placental abruption).   Having a cervix that is too weak to hold the fetus in the uterus (incompetent cervix).   Having too much fluid in the amniotic sac (polyhydramnios).   Taking illegal drugs or smoking while pregnant.   Not gaining enough weight while pregnant.   Being younger than 18 and older than 24 years old.   Having a low socioeconomic status.   Being African American. SYMPTOMS Signs and symptoms of preterm labor include:   Menstrual-like cramps, abdominal pain, or back pain.  Uterine contractions that are regular, as frequent as six in an hour, regardless of their intensity (may be mild or painful).  Contractions that start on the top of the uterus and spread down to the lower abdomen and back.   A sense of increased pelvic pressure.   A watery or bloody mucus discharge that  comes from the vagina.  TREATMENT Depending on the length of the pregnancy and other circumstances, your health care provider may suggest bed rest. If necessary, there are medicines that can be given to stop contractions and to mature the fetal lungs. If labor happens before 34 weeks of pregnancy, a prolonged hospital stay may be recommended. Treatment depends on the condition of both you and the fetus.  WHAT SHOULD YOU DO IF YOU THINK YOU ARE IN PRETERM LABOR? Call your health care provider right away. You will need to go to the hospital to get checked immediately. HOW CAN YOU PREVENT PRETERM LABOR IN FUTURE PREGNANCIES? You should:   Stop smoking if you smoke.  Maintain healthy weight gain and avoid chemicals and drugs that are not necessary.  Be watchful for any type of infection.  Inform your health care provider if you have a known history of preterm labor. Document Released: 07/01/2003 Document Revised: 12/11/2012 Document Reviewed: 05/13/2012 Exit[employer] Patient Information 2014 [employer].    

## 2013-12-11 LAB — OB RESULTS CONSOLE GBS: GBS: NEGATIVE

## 2013-12-18 ENCOUNTER — Inpatient Hospital Stay (HOSPITAL_COMMUNITY)
Admission: AD | Admit: 2013-12-18 | Discharge: 2013-12-18 | Disposition: A | Discharge status: Home / Self Care | Payer: Commercial Managed Care - PPO | Source: Ambulatory Visit | Attending: Obstetrics and Gynecology | Admitting: Obstetrics and Gynecology

## 2013-12-18 ENCOUNTER — Encounter (HOSPITAL_COMMUNITY): Payer: Self-pay | Admitting: General Practice

## 2013-12-18 DIAGNOSIS — Z87891 Personal history of nicotine dependence: Secondary | ICD-10-CM | POA: Diagnosis not present

## 2013-12-18 DIAGNOSIS — R109 Unspecified abdominal pain: Secondary | ICD-10-CM | POA: Diagnosis not present

## 2013-12-18 DIAGNOSIS — O9989 Other specified diseases and conditions complicating pregnancy, childbirth and the puerperium: Secondary | ICD-10-CM | POA: Diagnosis not present

## 2013-12-18 DIAGNOSIS — M549 Dorsalgia, unspecified: Secondary | ICD-10-CM | POA: Diagnosis not present

## 2013-12-18 DIAGNOSIS — N898 Other specified noninflammatory disorders of vagina: Secondary | ICD-10-CM

## 2013-12-18 DIAGNOSIS — O99891 Other specified diseases and conditions complicating pregnancy: Secondary | ICD-10-CM | POA: Insufficient documentation

## 2013-12-18 DIAGNOSIS — O26893 Other specified pregnancy related conditions, third trimester: Secondary | ICD-10-CM

## 2013-12-18 LAB — URINALYSIS, ROUTINE W REFLEX MICROSCOPIC
BILIRUBIN URINE: NEGATIVE
GLUCOSE, UA: NEGATIVE mg/dL
KETONES UR: NEGATIVE mg/dL
Leukocytes, UA: NEGATIVE
Nitrite: NEGATIVE
Protein, ur: NEGATIVE mg/dL
Specific Gravity, Urine: 1.01 (ref 1.005–1.030)
Urobilinogen, UA: 0.2 mg/dL (ref 0.0–1.0)
pH: 7 (ref 5.0–8.0)

## 2013-12-18 LAB — URINE MICROSCOPIC-ADD ON

## 2013-12-18 NOTE — MAU Provider Note (Signed)
History     CSN: [id number]  Arrival date and time: 12/18/13 1820   None     Chief Complaint  Patient presents with  . Rupture of Membranes   HPI This is a 23 y.o. female at [redacted]w[redacted]d who presents with c/o wetness on underwear off and on for 2 days. Does not require a pad. Has some pressure in abdomen and low back . Also has slight decrease in fetal movement. States Cervix has been 2cm    Rn Note:  Patient states she has had wet panties on and off since 8-25. Has had periods of feeling wet panties since that time off and on. Not wearing a pad. State she has been having back and abdominal pain and pressure. Reports fetal movement not as much as usual.       OB History   Grav Para Term Preterm Abortions TAB SAB Ect Mult Living   0 0 0 0 0 0 1      Past Medical History  Diagnosis Date  . Anxiety     Not on meds  . Panic attacks     Not on meds  . Abnormal Pap smear   . H/O varicella     Past Surgical History  Procedure Laterality Date  . Appendectomy  2007  . Cholecystectomy, laparoscopic  2007  . Gynecologic cryosurgery      Family History  Problem Relation Age of Onset  . Anesthesia problems Other     "allergic to anesthesia"  . Other Neg Hx   . Hypertension Mother   . Hypertension Maternal Grandmother   . Diabetes Maternal Grandfather   . Heart disease Father     sudden cardiac death    History  Substance Use Topics  . Smoking status: Former Smoker -- 0.50 packs/day for .5 years    Types: Cigarettes    Quit date: 03/30/2011  . Smokeless tobacco: Not on file  . Alcohol Use: No    Allergies:  Allergies  Allergen Reactions  . Onion Anaphylaxis and Swelling    Tongue swells and it becomes hard to breathe  . Peanut-Containing Drug Products Swelling    Tongue swells and it becomes hard to breathe     Prescriptions prior to admission  Medication Sig Dispense Refill  . NIFEdipine (PROCARDIA) 10 MG capsule Take 1 capsule (10 mg total) by mouth  every 6 (six) hours.  30 capsule  1  . Prenatal Vit-Fe Fumarate-FA (PRENATAL MULTIVITAMIN) TABS Take 1 tablet by mouth at bedtime.         Review of Systems  Constitutional: Negative for fever, chills and malaise/fatigue.  Gastrointestinal: Positive for abdominal pain (cramping/pressure). Negative for nausea and vomiting.  Genitourinary:       Wetness, decreased FM  Musculoskeletal: Positive for back pain.   Physical Exam   Blood pressure 124/75, pulse 89, temperature 98.7 F (37.1 C), temperature source Oral, resp. rate 16, height 4' 11.5" (1.511 m), weight 52.527 kg (115 lb 12.8 oz), SpO2 96.00%, unknown if currently breastfeeding.  Physical Exam  Constitutional: She is oriented to person, place, and time. She appears well-developed and well-nourished. No distress.  HENT:  Head: Normocephalic.  Cardiovascular: Normal rate.   Respiratory: Effort normal.  GI: Soft. She exhibits no distension and no mass. There is no tenderness. There is no rebound and no guarding.  Genitourinary: Uterus normal. Vaginal discharge (mucous discharge, no pooling, no ferning, ) found.  Musculoskeletal: Normal range of motion.  Neurological:  She is alert and oriented to person, place, and time.  Skin: Skin is warm and dry.  Psychiatric: She has a normal mood and affect.   Dilation: 1.5 Effacement (%): 80 Station: -2 Presentation: Vertex Exam by:: Elo Marmolejos, cnm  MAU Course  Procedures  MDM FHR reactive Irregular mild contractions   Assessment and Plan  A:  [facility]UP at [redacted]w[redacted]d       No evidence of ruptured membranes      Not in labor      Reassuring FHR, Category I  P:  Discharge home       Labor precautions       Followup in office  [facility] 12/18/2013, 7:01 PM

## 2013-12-18 NOTE — Discharge Instructions (Signed)

## 2013-12-18 NOTE — MAU Note (Signed)
Patient states she has had wet panties on and off since 8-25. Has had periods of feeling wet panties since that time off and on. Not wearing a pad. State she has been having back and abdominal pain and pressure. Reports fetal movement not as much as usual.

## 2013-12-23 ENCOUNTER — Encounter (HOSPITAL_COMMUNITY): Payer: Self-pay | Admitting: *Deleted

## 2013-12-23 ENCOUNTER — Telehealth (HOSPITAL_COMMUNITY): Payer: Self-pay | Admitting: *Deleted

## 2013-12-23 NOTE — Telephone Encounter (Signed)
Preadmission screen  

## 2013-12-30 ENCOUNTER — Encounter (HOSPITAL_COMMUNITY): Payer: Commercial Managed Care - PPO | Admitting: Anesthesiology

## 2013-12-30 ENCOUNTER — Inpatient Hospital Stay (HOSPITAL_COMMUNITY)
Admission: RE | Admit: 2013-12-30 | Discharge: 2014-01-01 | DRG: 775 | Disposition: A | Discharge status: Home / Self Care | Payer: Commercial Managed Care - PPO | Source: Ambulatory Visit | Attending: Obstetrics and Gynecology | Admitting: Obstetrics and Gynecology

## 2013-12-30 ENCOUNTER — Encounter (HOSPITAL_COMMUNITY): Payer: Self-pay

## 2013-12-30 ENCOUNTER — Inpatient Hospital Stay (HOSPITAL_COMMUNITY): Payer: Commercial Managed Care - PPO | Admitting: Anesthesiology

## 2013-12-30 DIAGNOSIS — O365990 Maternal care for other known or suspected poor fetal growth, unspecified trimester, not applicable or unspecified: Secondary | ICD-10-CM | POA: Diagnosis present

## 2013-12-30 DIAGNOSIS — F411 Generalized anxiety disorder: Secondary | ICD-10-CM | POA: Diagnosis present

## 2013-12-30 DIAGNOSIS — Z833 Family history of diabetes mellitus: Secondary | ICD-10-CM

## 2013-12-30 DIAGNOSIS — Z87891 Personal history of nicotine dependence: Secondary | ICD-10-CM

## 2013-12-30 DIAGNOSIS — O99344 Other mental disorders complicating childbirth: Secondary | ICD-10-CM | POA: Diagnosis present

## 2013-12-30 LAB — CBC
HCT: 34.3 % — ABNORMAL LOW (ref 36.0–46.0)
HEMOGLOBIN: 11.9 g/dL — AB (ref 12.0–15.0)
MCH: 33.2 pg (ref 26.0–34.0)
MCHC: 34.7 g/dL (ref 30.0–36.0)
MCV: 95.8 fL (ref 78.0–100.0)
Platelets: 285 10*3/uL (ref 150–400)
RBC: 3.58 MIL/uL — ABNORMAL LOW (ref 3.87–5.11)
RDW: 12.7 % (ref 11.5–15.5)
WBC: 13.1 10*3/uL — AB (ref 4.0–10.5)

## 2013-12-30 LAB — RPR

## 2013-12-30 MED ORDER — PHENYLEPHRINE 40 MCG/ML (10ML) SYRINGE FOR IV PUSH (FOR BLOOD PRESSURE SUPPORT)
80.0000 ug | PREFILLED_SYRINGE | INTRAVENOUS | Status: DC | PRN
  Filled 2013-12-30: qty 2
  Filled 2013-12-30: qty 10

## 2013-12-30 MED ORDER — OXYTOCIN 40 UNITS IN LACTATED RINGERS INFUSION - SIMPLE MED
62.5000 mL/h | INTRAVENOUS | Status: DC
  Administered 2013-12-30: 62.5 mL/h via INTRAVENOUS

## 2013-12-30 MED ORDER — SENNOSIDES-DOCUSATE SODIUM 8.6-50 MG PO TABS
2.0000 | ORAL_TABLET | ORAL | Status: DC
  Administered 2013-12-31 – 2014-01-01 (×2): 2 via ORAL
  Filled 2013-12-30 (×2): qty 2

## 2013-12-30 MED ORDER — BENZOCAINE-MENTHOL 20-0.5 % EX AERO
1.0000 "application " | INHALATION_SPRAY | CUTANEOUS | Status: DC | PRN
  Administered 2013-12-30: 1 via TOPICAL
  Filled 2013-12-30: qty 56

## 2013-12-30 MED ORDER — ONDANSETRON HCL 4 MG/2ML IJ SOLN: 4.0000 mg | Freq: Four times a day (QID) | INTRAMUSCULAR | Status: DC | PRN

## 2013-12-30 MED ORDER — ONDANSETRON HCL 4 MG/2ML IJ SOLN: 4.0000 mg | INTRAMUSCULAR | Status: DC | PRN

## 2013-12-30 MED ORDER — OXYCODONE-ACETAMINOPHEN 5-325 MG PO TABS: 1.0000 | ORAL_TABLET | ORAL | Status: DC | PRN

## 2013-12-30 MED ORDER — LIDOCAINE HCL (PF) 1 % IJ SOLN
30.0000 mL | INTRAMUSCULAR | Status: DC | PRN
  Filled 2013-12-30: qty 30

## 2013-12-30 MED ORDER — DIPHENHYDRAMINE HCL 50 MG/ML IJ SOLN: 12.5000 mg | INTRAMUSCULAR | Status: DC | PRN

## 2013-12-30 MED ORDER — PHENYLEPHRINE 40 MCG/ML (10ML) SYRINGE FOR IV PUSH (FOR BLOOD PRESSURE SUPPORT)
80.0000 ug | PREFILLED_SYRINGE | INTRAVENOUS | Status: DC | PRN
  Filled 2013-12-30: qty 2

## 2013-12-30 MED ORDER — ZOLPIDEM TARTRATE 5 MG PO TABS: 5.0000 mg | ORAL_TABLET | Freq: Every evening | ORAL | Status: DC | PRN

## 2013-12-30 MED ORDER — OXYTOCIN 40 UNITS IN LACTATED RINGERS INFUSION - SIMPLE MED
1.0000 m[IU]/min | INTRAVENOUS | Status: DC
Start: 2013-12-30 — End: 2013-12-30
  Administered 2013-12-30: 1 m[IU]/min via INTRAVENOUS
  Filled 2013-12-30: qty 1000

## 2013-12-30 MED ORDER — CITRIC ACID-SODIUM CITRATE 334-500 MG/5ML PO SOLN: 30.0000 mL | ORAL | Status: DC | PRN

## 2013-12-30 MED ORDER — BISACODYL 10 MG RE SUPP: 10.0000 mg | Freq: Every day | RECTAL | Status: DC | PRN

## 2013-12-30 MED ORDER — OXYCODONE-ACETAMINOPHEN 5-325 MG PO TABS: 2.0000 | ORAL_TABLET | ORAL | Status: DC | PRN

## 2013-12-30 MED ORDER — DIPHENHYDRAMINE HCL 25 MG PO CAPS
25.0000 mg | ORAL_CAPSULE | Freq: Four times a day (QID) | ORAL | Status: DC | PRN
Start: 2013-12-30 — End: 2014-01-01

## 2013-12-30 MED ORDER — TERBUTALINE SULFATE 1 MG/ML IJ SOLN: 0.2500 mg | Freq: Once | INTRAMUSCULAR | Status: DC | PRN

## 2013-12-30 MED ORDER — FLEET ENEMA 7-19 GM/118ML RE ENEM
1.0000 | ENEMA | RECTAL | Status: DC | PRN
Start: 2013-12-30 — End: 2013-12-30

## 2013-12-30 MED ORDER — LACTATED RINGERS IV SOLN
500.0000 mL | INTRAVENOUS | Status: DC | PRN
  Administered 2013-12-30: 300 mL via INTRAVENOUS

## 2013-12-30 MED ORDER — ACETAMINOPHEN 325 MG PO TABS: 650.0000 mg | ORAL_TABLET | ORAL | Status: DC | PRN

## 2013-12-30 MED ORDER — SIMETHICONE 80 MG PO CHEW: 80.0000 mg | CHEWABLE_TABLET | ORAL | Status: DC | PRN

## 2013-12-30 MED ORDER — LIDOCAINE HCL (PF) 1 % IJ SOLN
INTRAMUSCULAR | Status: DC | PRN
  Administered 2013-12-30 (×2): 5 mL

## 2013-12-30 MED ORDER — HYDROXYZINE HCL 50 MG PO TABS
50.0000 mg | ORAL_TABLET | Freq: Four times a day (QID) | ORAL | Status: DC | PRN
  Filled 2013-12-30: qty 1

## 2013-12-30 MED ORDER — LACTATED RINGERS IV SOLN
500.0000 mL | Freq: Once | INTRAVENOUS | Status: AC
  Administered 2013-12-30: 1000 mL via INTRAVENOUS

## 2013-12-30 MED ORDER — FLEET ENEMA 7-19 GM/118ML RE ENEM: 1.0000 | ENEMA | Freq: Every day | RECTAL | Status: DC | PRN

## 2013-12-30 MED ORDER — EPHEDRINE 5 MG/ML INJ
10.0000 mg | INTRAVENOUS | Status: DC | PRN
  Filled 2013-12-30: qty 2
  Filled 2013-12-30: qty 4

## 2013-12-30 MED ORDER — OXYTOCIN BOLUS FROM INFUSION: 500.0000 mL | INTRAVENOUS | Status: DC

## 2013-12-30 MED ORDER — IBUPROFEN 600 MG PO TABS
600.0000 mg | ORAL_TABLET | Freq: Four times a day (QID) | ORAL | Status: DC
Start: 2013-12-30 — End: 2014-01-01
  Administered 2013-12-30 – 2014-01-01 (×7): 600 mg via ORAL
  Filled 2013-12-30 (×7): qty 1

## 2013-12-30 MED ORDER — TETANUS-DIPHTH-ACELL PERTUSSIS 5-2.5-18.5 LF-MCG/0.5 IM SUSP: 0.5000 mL | Freq: Once | INTRAMUSCULAR | Status: DC

## 2013-12-30 MED ORDER — LANOLIN HYDROUS EX OINT: TOPICAL_OINTMENT | CUTANEOUS | Status: DC | PRN

## 2013-12-30 MED ORDER — PRENATAL MULTIVITAMIN CH
1.0000 | ORAL_TABLET | Freq: Every day | ORAL | Status: DC
  Administered 2013-12-31: 1 via ORAL
  Filled 2013-12-30: qty 1

## 2013-12-30 MED ORDER — LACTATED RINGERS IV SOLN
INTRAVENOUS | Status: DC
  Administered 2013-12-30 (×2): via INTRAVENOUS

## 2013-12-30 MED ORDER — ONDANSETRON HCL 4 MG PO TABS: 4.0000 mg | ORAL_TABLET | ORAL | Status: DC | PRN

## 2013-12-30 MED ORDER — DIBUCAINE 1 % RE OINT: 1.0000 "application " | TOPICAL_OINTMENT | RECTAL | Status: DC | PRN

## 2013-12-30 MED ORDER — EPHEDRINE 5 MG/ML INJ
10.0000 mg | INTRAVENOUS | Status: DC | PRN
  Filled 2013-12-30: qty 2

## 2013-12-30 MED ORDER — WITCH HAZEL-GLYCERIN EX PADS: 1.0000 "application " | MEDICATED_PAD | CUTANEOUS | Status: DC | PRN

## 2013-12-30 MED ORDER — FENTANYL 2.5 MCG/ML BUPIVACAINE 1/10 % EPIDURAL INFUSION (WH - ANES)
14.0000 mL/h | INTRAMUSCULAR | Status: DC | PRN
  Administered 2013-12-30: 14 mL/h via EPIDURAL
  Filled 2013-12-30: qty 125

## 2013-12-30 NOTE — Anesthesia Procedure Notes (Signed)
Epidural Patient location during procedure: OB Start time: 12/30/2013 10:18 AM  Staffing Anesthesiologist: Brayton Caves Performed by: anesthesiologist   Preanesthetic Checklist Completed: patient identified, site marked, surgical consent, pre-op evaluation, timeout performed, IV checked, risks and benefits discussed and monitors and equipment checked  Epidural Patient position: sitting Prep: site prepped and draped and DuraPrep Patient monitoring: continuous pulse ox and blood pressure Approach: midline Location: L3-L4 Injection technique: LOR air  Needle:  Needle type: Tuohy  Needle gauge: 17 G Needle length: 9 cm and 9 Needle insertion depth: 4 cm Catheter type: closed end flexible Catheter size: 19 Gauge Catheter at skin depth: 10 cm Test dose: negative  Assessment Events: blood not aspirated, injection not painful, no injection resistance, negative IV test and no paresthesia  Additional Notes Patient identified.  Risk benefits discussed including failed block, incomplete pain control, headache, nerve damage, paralysis, blood pressure changes, nausea, vomiting, reactions to medication both toxic or allergic, and postpartum back pain.  Patient expressed understanding and wished to proceed.  All questions were answered.  Sterile technique used throughout procedure and epidural site dressed with sterile barrier dressing. No paresthesia or other complications noted.The patient did not experience any signs of intravascular injection such as tinnitus or metallic taste in mouth nor signs of intrathecal spread such as rapid motor block. Please see nursing notes for vital signs.

## 2013-12-30 NOTE — Progress Notes (Signed)
Delivery Note At 1:57 PM a viable female was delivered via Vaginal, Spontaneous Delivery (Presentation: Right Occiput Anterior).  APGAR: 9, 9; weight 5 lb (2268 g).   Placenta status: Intact, Spontaneous.  Cord: 3 vessels with the following complications: None.  Cord pH: pending  Anesthesia: Epidural  Episiotomy: None Lacerations: None Suture Repair: N/A Est. Blood Loss (mL): 300  Mom to postpartum.  Baby to Couplet care / Skin to Skin. Skin tag on newborn's right cheek and in front of right ear-peds notified Jong Rickman II,Adasha Boehme E 12/30/2013, 2:29 PM

## 2013-12-30 NOTE — Anesthesia Preprocedure Evaluation (Signed)
Anesthesia Evaluation  Patient identified by MRN, date of birth, ID band Patient awake    Reviewed: Allergy & Precautions, H&P , Patient's Chart, lab work & pertinent test results  Airway Mallampati: II TM Distance: >3 FB Neck ROM: full    Dental   Pulmonary former smoker,  breath sounds clear to auscultation        Cardiovascular Rhythm:regular Rate:Normal     Neuro/Psych PSYCHIATRIC DISORDERS Anxiety    GI/Hepatic   Endo/Other    Renal/GU      Musculoskeletal   Abdominal   Peds  Hematology   Anesthesia Other Findings   Reproductive/Obstetrics (+) Pregnancy                           Anesthesia Physical Anesthesia Plan  ASA: II  Anesthesia Plan: Epidural   Post-op Pain Management:    Induction:   Airway Management Planned:   Additional Equipment:   Intra-op Plan:   Post-operative Plan:   Informed Consent: I have reviewed the patients History and Physical, chart, labs and discussed the procedure including the risks, benefits and alternatives for the proposed anesthesia with the patient or authorized representative who has indicated his/her understanding and acceptance.     Plan Discussed with:   Anesthesia Plan Comments:         Anesthesia Quick Evaluation

## 2013-12-30 NOTE — H&P (Signed)
Melissa Ware is a 23 y.o. female presenting for IOL secondary to SGA. U/S about 1 week ago showed EFW 5# 1oz. No HA, no vision change, no epigastric pain, no ROM. Maternal Medical History:  Fetal activity: Perceived fetal activity is normal.      OB History   Grav Para Term Preterm Abortions TAB SAB Ect Mult Living   0 0 0 0 0 0 1     Past Medical History  Diagnosis Date  . Anxiety     Not on meds  . Panic attacks     Not on meds  . Abnormal Pap smear   . H/O varicella   . Vaginal Pap smear, abnormal    Past Surgical History  Procedure Laterality Date  . Appendectomy  2007  . Cholecystectomy, laparoscopic  2007  . Gynecologic cryosurgery     Family History: family history includes Diabetes in her maternal grandfather; Heart disease in her father; Hypertension in her maternal grandmother and mother; Other in her other. Social History:  reports that she quit smoking about 2 years ago. Her smoking use included Cigarettes. She has a .25 pack-year smoking history. She does not have any smokeless tobacco history on file. She reports that she does not drink alcohol or use illicit drugs.   Prenatal Transfer Tool  Maternal Diabetes: No Genetic Screening: Normal Maternal Ultrasounds/Referrals: Normal Fetal Ultrasounds or other Referrals:  None Maternal Substance Abuse:  No Significant Maternal Medications:  None Significant Maternal Lab Results:  None Other Comments:  None  Review of Systems  Eyes: Negative for blurred vision.  Gastrointestinal: Negative for abdominal pain.  Neurological: Negative for headaches.      Blood pressure 116/74, pulse 102, height  (1.499 m), weight 53.524 kg (118 lb), unknown if currently breastfeeding. Maternal Exam:  Uterine Assessment: Contraction strength is mild.  Contraction frequency is irregular.   Abdomen: Patient reports no abdominal tenderness. Fetal presentation: vertex     Fetal Exam Fetal State Assessment:  Category I - tracings are normal.     Physical Exam  Cardiovascular: Normal rate and regular rhythm.   Respiratory: Effort normal and breath sounds normal.  GI: Soft. Bowel sounds are normal.  Neurological: She has normal reflexes.   AROM clear Cx 3/80/-2/vtx Prenatal labs: ABO, Rh: A/Positive/-- (02/12 0000) Antibody: Negative (02/12 0000) Rubella: Immune (02/12 0000) RPR: Nonreactive (02/12 0000)  HBsAg: Negative (02/12 0000)  HIV: Non-reactive (02/12 0000)  GBS: Negative (08/20 0000)   Assessment/Plan: 23 yo G2P1 at 86 1/7 weeks with SGA D/W patient pitocin and risks   Nollan Muldrow II,Rosalita Carey E 12/30/2013, 8:00 AM

## 2013-12-31 ENCOUNTER — Inpatient Hospital Stay (HOSPITAL_COMMUNITY): Admission: RE | Admit: 2013-12-31 | Payer: Commercial Managed Care - PPO | Source: Ambulatory Visit

## 2013-12-31 LAB — CBC
HEMATOCRIT: 31.1 % — AB (ref 36.0–46.0)
Hemoglobin: 10.7 g/dL — ABNORMAL LOW (ref 12.0–15.0)
MCH: 33.3 pg (ref 26.0–34.0)
MCHC: 34.4 g/dL (ref 30.0–36.0)
MCV: 96.9 fL (ref 78.0–100.0)
Platelets: 221 10*3/uL (ref 150–400)
RBC: 3.21 MIL/uL — ABNORMAL LOW (ref 3.87–5.11)
RDW: 12.9 % (ref 11.5–15.5)
WBC: 12.3 10*3/uL — AB (ref 4.0–10.5)

## 2013-12-31 MED ORDER — INFLUENZA VAC SPLIT QUAD 0.5 ML IM SUSY
0.5000 mL | PREFILLED_SYRINGE | INTRAMUSCULAR | Status: DC
  Filled 2013-12-31: qty 0.5

## 2013-12-31 NOTE — Progress Notes (Signed)
Post Partum Day 1 Subjective: no complaints, up ad lib, voiding, tolerating PO and + flatus  Objective: Blood pressure 105/67, pulse 77, temperature 98.2 F (36.8 C), temperature source Oral, resp. rate 20, height  (1.499 m), weight 118 lb (53.524 kg), SpO2 99.00%, unknown if currently breastfeeding.  Physical Exam:  General: alert and cooperative Lochia: appropriate Uterine Fundus: firm Incision: perineum intact DVT Evaluation: No evidence of DVT seen on physical exam. Negative Homan's sign. No cords or calf tenderness. No significant calf/ankle edema.   Recent Labs  12/30/13 0800 12/31/13 0610  HGB 11.9* 10.7*  HCT 34.3* 31.1*    Assessment/Plan: Plan for discharge tomorrow   LOS: 1 day   Arsal Tappan G 12/31/2013, 8:11 AM

## 2013-12-31 NOTE — Lactation Note (Signed)
This note was copied from the chart of Melissa Ware. Lactation Consultation Note  Patient Name: Melissa Ware Date: 12/31/2013 Reason for consult: Follow-up assessment Following up from this morning consult. Baby awake now ar 24 hours old , only latch score = 7.  Voiding and one small mec smear. MBU RN informed LC , mom may be discharged home. LC reviewed basics - breast massage, hand express, , several small drops of colostrum noted from right breast, Both breast small , ( mom reports breast changes with pregnancy , leakage, change in breast size, and soreness) ,  Both nipples small and semi inverted, but the areolas are compressible, Baby awake , latched with depth on the right  breast for 5 mins and released. LC assessed baby's mouth , baby able to stretch tongue over gum line , noted a high palate, And short labial frenulum( but able to stretch upper lip ). LC felt the use of a nipple shield is indicated , sized for #16 ( to small ), Sized for #20 NS - good fit. Instilled formula in to the top of nipple shield and baby latched with depth and fed for 15 mins in a  Consistent swallowing pattern. ( took 7 ml instilling formula into the top to start and on the side during the feeding.)  Baby released and acting still hungry so mom reapplied #20 NS on the left breast , using the syringe with formula ( 4 ml )  And the baby fed for and 8 -10 mins in a consistent swallowing pattern and then became non-nutritive. [name] 's recommendation for this mom and baby is to stay until tomorrow so we can see consistency with feedings and another stool. And continue to use the nipple shield and instill EBM or formula into the top for extra calories and to get the baby in a consistent pattern. Post pump with a DEBP for 10 -15 mins and save milk and use it to supplement back to the baby. LC suggested to mom to call [name] today to  and arrange for a DEBP for tomorrow. If there isn't a pump available  consider a [name] loaner. If she ended up going home tonight to continue the above plan.        Maternal Data Formula Feeding for Exclusion: No Has patient been taught Hand Expression?: Yes Does the patient have breastfeeding experience prior to this delivery?: Yes  Feeding Feeding Type: Breast Fed (left breast , with #20 NS ) Length of feed: 5 min  LATCH Score/Interventions Latch: Grasps breast easily, tongue down, lips flanged, rhythmical sucking. Intervention(s): Adjust position;Assist with latch;Breast massage;Breast compression  Audible Swallowing: Spontaneous and intermittent  Type of Nipple: Flat  Comfort (Breast/Nipple): Soft / non-tender     Hold (Positioning): Assistance needed to correctly position infant at breast and maintain latch. Intervention(s): Support Pillows;Position options;Skin to skin;Breastfeeding basics reviewed  LATCH Score: 8  Lactation Tools Discussed/Used Tools: Nipple Shields Nipple shield size: 20 Shell Type: Inverted Breast pump type: Manual WIC Program: Yes ([employer] per mom ) Pump Review: Setup, frequency, and cleaning;Milk Storage (reviewed ) Initiated by:: MAI  Date initiated:: 12/31/13   Consult Status Consult Status: Follow-up Date: 12/31/13 Follow-up type: In-patient    Melissa Ware 12/31/2013, 3:12 PM

## 2013-12-31 NOTE — Anesthesia Postprocedure Evaluation (Signed)
Anesthesia Post Note  Patient: Astronomer  Procedure(s) Performed: * No procedures listed *  Anesthesia type: Epidural  Patient location: Mother/Baby  Post pain: Pain level controlled  Post assessment: Post-op Vital signs reviewed  Last Vitals:  Filed Vitals:   12/31/13 0553  BP: 105/67  Pulse: 77  Temp: 36.8 C  Resp: 20    Post vital signs: Reviewed  Level of consciousness: awake  Complications: No apparent anesthesia complications

## 2013-12-31 NOTE — Progress Notes (Signed)
Clinical Social Work Department PSYCHOSOCIAL ASSESSMENT - MATERNAL/CHILD 12/31/2013  Patient:  Melissa Ware,Melissa Ware  Account Number:  [id number]  Admit Date:  12/30/2013  Childs Name:   Melissa Ware    Clinical Social Worker:  Elan Brainerd, CLINICAL SOCIAL WORKER   Date/Time:  12/31/2013 10:00 AM  Date Referred:  12/30/2013   Referral source  Central Nursery     Referred reason  Depression/Anxiety   Other referral source:    I:  FAMILY / HOME ENVIRONMENT Child's legal guardian:  PARENT  Guardian - Name Guardian - Age Guardian - Address  Melissa Ware 23, [place], [place] [address]  Melissa Ware  same as above   Other household support members/support persons Name Relationship DOB  Noah SON 2013   Other support:   MOB and FOB shared belief that they are well supported by their parents who live near their home.    II  PSYCHOSOCIAL DATA Information Source:  Family Interview  Financial and Community Resources Employment:   MOB stated that she is employed at [employer] and will take apporximately 10 weeks off from work.  FOB stated that he is a welder.  They shared the belief that they are well supported by employers.   Financial resources:  Private Insurance If Medicaid - County:  [place] Other  WIC  Food Stamps   School / Grade:  N/A Maternity Care Coordinator / Child Services Coordination / Early Interventions:   N/A  Cultural issues impacting care:   None reported    III  STRENGTHS Strengths  Adequate Resources  Home prepared for Child (including basic supplies)  Supportive family/friends   Strength comment:    IV  RISK FACTORS AND CURRENT PROBLEMS Current Problem:  YES   Risk Factor & Current Problem Patient Issue Family Issue Risk Factor / Current Problem Comment  Mental Illness N N MOB presents with history of anxiety and panic attacks.  MOB denied recent acute symptoms, FOB confirmed.  She reported history of  Xanax, but stated that she has not needed any medication for almost 3 years.    V  SOCIAL WORK ASSESSMENT CSW met with MOB in her room in order to complete the assessment.  Consult ordered due to MOB presenting with history of anxiety and panic attacks.  MOB provided consent for the FOB to be present.  MOB and FOB presented as easily engaged and receptive to the CSW intervention.  MOB displayed appropriate range in affect and presented to be in an appropriate mood.  She smiled as she discussed her thoughts and feelings related to having another baby, and was observed to be attentive to the newborn.  MOB receptive to discussing reason for CSW consult, and did not present with any acute symptoms of anxiety.    MOB and FOB expressed excitement as they transition to having a newborn.  Parents shared belief that they are well supported by their family and that the home is prepared for the baby.  CSW explored potential feelings and behaviors that MOB's 2 year old son may experience as he transitions to becoming a big brother.  MOB discussed normative anxiety as she transitions to having 2 children, including how she will go to the grocery store and visit family with two small children.  CSW validated these feelings, and explored with MOB how she will be able to benefit from newborn behavior of frequently sleeping.   CSW continued to explore any additional psychosocial stressors, but MOB and   FOB denied any other recent changes or life adjustments.  They stated that they have been together for 24 weeks, and started their relationship shortly after the birth of Noah.    MOB denied history of postpartum depression, but does endorse history of anxiety and panic attacks.  She presented with limited self-awareness of what triggers her anxiety, but upon further exploration, she acknowledged that anxiety is often triggered when she has a long list of "to do" items and not enough time.  MOB also acknowledged that she is  often resistant to accepting help since it is important for her to be self-sufficient.  CSW continued to explore resistance to accepting help, and MOB does recognize that she needs to allow others to help her since she would help others if they were overwhelmed and that she does not like the person she becomes (irritable and moody) when she becomes anxious.  MOB is able to verbalize importance of accepting help, but will continue to require time to become receptive to accepting help.  MOB and FOB denied any recent anxiety, and denied any panic attacks during her pregnancy. MOB endorsed history of Xanax, but shared that she has not needed any medication in almost 3 years.  MOB acknowledged that she is at an increased risk for postpartum depression due to her history of anxiety. MOB and FOB receptive to contacting medical providers if symptoms occur.   No barriers to discharge.     VI SOCIAL WORK PLAN Social Work Plan  Patient/Family Education  No Further Intervention Required / No Barriers to Discharge   Type of pt/family education:   Postpartum depression and anxiety   If child protective services report - county:   If child protective services report - date:   Information/referral to community resources comment:   N/A   Other social work plan:   CSW to provide ongoing emotional support PRN.    

## 2013-12-31 NOTE — Lactation Note (Signed)
This note was copied from the chart of Melissa Shiloh Nance. Lactation Consultation Note  Patient Name: Melissa Ware ZOXWR'U Date: 12/31/2013 Reason for consult: Initial assessment (baby sleepying - per mom recently tried to feed at 1100., few sucks , mom plans to page [facility] ) Per mom baby was wide awake during the night and fussy , ended up supplementing a little formula, but the baby didn't like it. Per mom the baby hasn't pooped yet. I have been told I have inverted nipples and that is why I'm using the hand pump to pull them out. She will latch , but doesn't stay there long . LC reviewed positioning during the Breast feeding discussion.  LC encouraged mom to call LC with feeding cues for a feeding assessment and breast assessment . Mother informed of post-discharge support and given phone number to the lactation department, including services for phone call  assistance; out-patient appointments; and breastfeeding support group. List of other breastfeeding resources in the community given  in the handout. Encouraged mother to call for problems or concerns related to breastfeeding.   Maternal Data Formula Feeding for Exclusion: No Does the patient have breastfeeding experience prior to this delivery?: No  Feeding    LATCH Score/Interventions                Intervention(s): Breastfeeding basics reviewed;Position options     Lactation Tools Discussed/Used Tools: Pump (per mom has been shown how to use it, and using it to pre-pump ) Breast pump type: Manual   Consult Status Consult Status: Follow-up Date: 12/31/13 Follow-up type: In-patient    Kathrin Greathouse 12/31/2013, 11:34 AM

## 2014-01-01 MED ORDER — IBUPROFEN 600 MG PO TABS: 600.0000 mg | ORAL_TABLET | Freq: Four times a day (QID) | ORAL | Status: DC

## 2014-01-01 NOTE — Lactation Note (Signed)
This note was copied from the chart of Melissa Mel Nance. Lactation Consultation Note  Mother states she is sore from breastfeeding, using #20NS and supplementing with 15 ml of formula. She is filling NS with curved tip syringe for formula. Mother has WIC and plans to leave hospital and pick up DEBP. Provided mother with comfort gels and extra NS, reviewed engorgement. Encouraged mother to post pump 15-20 min at least 4-6 times a day. Made outpatient appt 9/18 1pm.  Suggest she call to assist with next feeding and check latch.   Patient Name: Melissa Ware ZOXWR'U Date: 01/01/2014 Reason for consult: Follow-up assessment   Maternal Data    Feeding Feeding Type: Bottle Fed - Formula Nipple Type: Slow - flow  LATCH Score/Interventions                      Lactation Tools Discussed/Used     Consult Status Consult Status: Complete    Hardie Pulley 01/01/2014, 9:44 AM

## 2014-01-01 NOTE — Discharge Summary (Signed)
Obstetric Discharge Summary Reason for Admission: induction of labor Prenatal Procedures: ultrasound Intrapartum Procedures: spontaneous vaginal delivery Postpartum Procedures: none Complications-Operative and Postpartum: none Hemoglobin  Date Value Ref Range Status  12/31/2013 10.7* 12.0 - 15.0 g/dL Final     HCT  Date Value Ref Range Status  12/31/2013 31.1* 36.0 - 46.0 % Final    Physical Exam:  General: alert and cooperative Lochia: appropriate Uterine Fundus: firm Incision: perineum intact DVT Evaluation: No evidence of DVT seen on physical exam. Negative Homan's sign. No cords or calf tenderness. No significant calf/ankle edema.  Discharge Diagnoses: Term Pregnancy-delivered  Discharge Information: Date: 01/01/2014 Activity: pelvic rest Diet: routine Medications: PNV and Ibuprofen Condition: stable Instructions: refer to practice specific booklet Discharge to: home   Newborn Data: Live born female  Birth Weight: 5 lb (2268 g) APGAR: 9, 9  Home with mother.  CURTIS,CAROL G 01/01/2014, 8:13 AM

## 2014-01-09 ENCOUNTER — Ambulatory Visit (HOSPITAL_COMMUNITY): Admit: 2014-01-09 | Payer: BC Managed Care – PPO

## 2014-02-23 ENCOUNTER — Encounter (HOSPITAL_COMMUNITY): Payer: Self-pay

## 2016-09-23 ENCOUNTER — Inpatient Hospital Stay (HOSPITAL_COMMUNITY)
Admission: AD | Admit: 2016-09-23 | Discharge: 2016-09-23 | Disposition: A | Discharge status: Home / Self Care | Payer: Commercial Managed Care - PPO | Source: Ambulatory Visit | Attending: Obstetrics and Gynecology | Admitting: Obstetrics and Gynecology

## 2016-09-23 ENCOUNTER — Encounter (HOSPITAL_COMMUNITY): Payer: Self-pay | Admitting: *Deleted

## 2016-09-23 DIAGNOSIS — N898 Other specified noninflammatory disorders of vagina: Secondary | ICD-10-CM | POA: Diagnosis present

## 2016-09-23 DIAGNOSIS — Z202 Contact with and (suspected) exposure to infections with a predominantly sexual mode of transmission: Secondary | ICD-10-CM | POA: Insufficient documentation

## 2016-09-23 DIAGNOSIS — N760 Acute vaginitis: Secondary | ICD-10-CM | POA: Diagnosis not present

## 2016-09-23 DIAGNOSIS — F419 Anxiety disorder, unspecified: Secondary | ICD-10-CM | POA: Insufficient documentation

## 2016-09-23 DIAGNOSIS — Z3202 Encounter for pregnancy test, result negative: Secondary | ICD-10-CM | POA: Insufficient documentation

## 2016-09-23 DIAGNOSIS — Z87891 Personal history of nicotine dependence: Secondary | ICD-10-CM | POA: Diagnosis not present

## 2016-09-23 DIAGNOSIS — Z113 Encounter for screening for infections with a predominantly sexual mode of transmission: Secondary | ICD-10-CM

## 2016-09-23 DIAGNOSIS — Z9049 Acquired absence of other specified parts of digestive tract: Secondary | ICD-10-CM | POA: Insufficient documentation

## 2016-09-23 LAB — WET PREP, GENITAL
Clue Cells Wet Prep HPF POC: NONE SEEN
SPERM: NONE SEEN
Trich, Wet Prep: NONE SEEN
Yeast Wet Prep HPF POC: NONE SEEN

## 2016-09-23 LAB — POCT PREGNANCY, URINE: Preg Test, Ur: NEGATIVE

## 2016-09-23 LAB — URINALYSIS, ROUTINE W REFLEX MICROSCOPIC
BILIRUBIN URINE: NEGATIVE
Bacteria, UA: NONE SEEN
Glucose, UA: NEGATIVE mg/dL
KETONES UR: 5 mg/dL — AB
NITRITE: NEGATIVE
PH: 6 (ref 5.0–8.0)
Protein, ur: NEGATIVE mg/dL
SPECIFIC GRAVITY, URINE: 1.024 (ref 1.005–1.030)

## 2016-09-23 MED ORDER — TRIAMCINOLONE ACETONIDE 0.1 % EX CREA: 1.0000 "application " | TOPICAL_CREAM | Freq: Two times a day (BID) | CUTANEOUS | 0 refills | Status: DC

## 2016-09-23 MED ORDER — HYDROXYZINE HCL 25 MG PO TABS: 25.0000 mg | ORAL_TABLET | Freq: Four times a day (QID) | ORAL | 0 refills | Status: DC | PRN

## 2016-09-23 NOTE — Discharge Instructions (Signed)

## 2016-09-23 NOTE — MAU Note (Signed)
Pt states she has vaginal swelling & burning, is very red & inflamed.  Has clear discharge, also itching.  Burns with urination.  Denies bleeding.

## 2016-09-23 NOTE — MAU Provider Note (Signed)
History     CSN: [id number]  Arrival date and time: 09/23/16 1701  First Provider Initiated Contact with Patient 09/23/16 1804      Chief Complaint  Patient presents with  . Vaginal Itching   Melissa Ware is a 26 y.o. Female who presents for vaginal irritation & itching. Has taken diflucan without relief. Denies change in soap or detergent. Does not douche. No new sex partner.    Vaginal Itching  The patient's primary symptoms include genital itching and vaginal discharge. The patient's pertinent negatives include no genital lesions, genital rash, pelvic pain or vaginal bleeding. This is a new problem. The current episode started yesterday. The problem occurs constantly. The problem has been waxing and waning. She is not pregnant. Pertinent negatives include no abdominal pain, dysuria, frequency or painful intercourse. The vaginal discharge was thin and white. There has been no bleeding. The symptoms are aggravated by intercourse and tactile pressure. She has tried antifungals for the symptoms. The treatment provided no relief. She is sexually active. No, her partner does not have an STD. She uses nothing for contraception. Her menstrual history has been regular. Her past medical history is significant for vaginosis. There is no history of herpes simplex, PID or an STD.   Past Medical History:  Diagnosis Date  . Abnormal Pap smear   . Anxiety    Not on meds  . H/O varicella   . Panic attacks    Not on meds  . Vaginal Pap smear, abnormal     Past Surgical History:  Procedure Laterality Date  . APPENDECTOMY  2007  . CHOLECYSTECTOMY, LAPAROSCOPIC  2007  . GYNECOLOGIC CRYOSURGERY      Family History  Problem Relation Age of Onset  . Other Other        "allergic to anesthesia"  . Hypertension Mother   . Hypertension Maternal Grandmother   . Diabetes Maternal Grandfather   . Heart disease Father        sudden cardiac death    Social History  Substance Use Topics  .  Smoking status: Former Smoker    Packs/day: 0.50    Years: 0.50    Types: Cigarettes    Quit date: 03/30/2011  . Smokeless tobacco: Not on file  . Alcohol use No    Allergies:  Allergies  Allergen Reactions  . Onion Anaphylaxis and Swelling    Tongue swells and it becomes hard to breathe  . Peanut-Containing Drug Products Anaphylaxis and Swelling    Tongue swells and it becomes hard to breathe     Prescriptions Prior to Admission  Medication Sig Dispense Refill Last Dose  . ibuprofen (ADVIL,MOTRIN) 600 MG tablet Take 1 tablet (600 mg total) by mouth every 6 (six) hours. 30 tablet 1   . Prenatal Vit-Fe Fumarate-FA (PRENATAL MULTIVITAMIN) TABS Take 1 tablet by mouth at bedtime.    12/29/2013 at Unknown time    Review of Systems  Constitutional: Negative.   Gastrointestinal: Negative for abdominal pain.  Genitourinary: Positive for vaginal discharge and vaginal pain. Negative for dyspareunia, dysuria, frequency, pelvic pain and vaginal bleeding.   Physical Exam   Blood pressure 116/85, pulse 87, temperature 98.7 F (37.1 C), temperature source Oral, resp. rate 16, last menstrual period 09/08/2016, unknown if currently breastfeeding.  Physical Exam  Nursing note and vitals reviewed. Constitutional: She is oriented to person, place, and time. She appears well-developed and well-nourished. No distress.  HENT:  Head: Normocephalic and atraumatic.  Eyes: Conjunctivae are normal. Right  eye exhibits no discharge. Left eye exhibits no discharge. No scleral icterus.  Neck: Normal range of motion.  Respiratory: Effort normal. No respiratory distress.  GI: Soft. There is no tenderness.  Genitourinary: Uterus normal. There is tenderness on the right labia. There is no rash or lesion on the right labia. There is tenderness on the left labia. There is no rash or lesion on the left labia. Cervix exhibits no motion tenderness and no friability. Right adnexum displays no mass and no tenderness.  Left adnexum displays no mass and no tenderness. There is erythema in the vagina. No bleeding in the vagina. Vaginal discharge (moderate amount of mucoid white discharge) found.  Neurological: She is alert and oriented to person, place, and time.  Skin: Skin is warm and dry. She is not diaphoretic.  Psychiatric: She has a normal mood and affect. Her behavior is normal. Judgment and thought content normal.    MAU Course  Procedures Results for orders placed or performed during the hospital encounter of 09/23/16 (from the past 24 hour(s))  Urinalysis, Routine w reflex microscopic (not at [facility])     Status: Abnormal   Collection Time: 09/23/16  5:25 PM  Result Value Ref Range   Color, Urine YELLOW YELLOW   APPearance CLEAR CLEAR   Specific Gravity, Urine 1.024 1.005 - 1.030   pH 6.0 5.0 - 8.0   Glucose, UA NEGATIVE NEGATIVE mg/dL   Hgb urine dipstick SMALL (A) NEGATIVE   Bilirubin Urine NEGATIVE NEGATIVE   Ketones, ur 5 (A) NEGATIVE mg/dL   Protein, ur NEGATIVE NEGATIVE mg/dL   Nitrite NEGATIVE NEGATIVE   Leukocytes, UA TRACE (A) NEGATIVE   RBC / HPF 6-30 0 - 5 RBC/hpf   WBC, UA 0-5 0 - 5 WBC/hpf   Bacteria, UA NONE SEEN NONE SEEN   Squamous Epithelial / LPF 6-30 (A) NONE SEEN   Mucous PRESENT   Pregnancy, urine POC     Status: None   Collection Time: 09/23/16  5:49 PM  Result Value Ref Range   Preg Test, Ur NEGATIVE NEGATIVE  Wet prep, genital     Status: Abnormal   Collection Time: 09/23/16  6:34 PM  Result Value Ref Range   Yeast Wet Prep HPF POC NONE SEEN NONE SEEN   Trich, Wet Prep NONE SEEN NONE SEEN   Clue Cells Wet Prep HPF POC NONE SEEN NONE SEEN   WBC, Wet Prep HPF POC FEW (A) NONE SEEN   Sperm NONE SEEN     MDM UPT negative GC/CT & wet prep  Assessment and Plan  A: 1. Acute vaginitis   2. Pregnancy examination or test, negative result   3. Screen for STD (sexually transmitted disease)    P: Discharge home Rx vistaril & triamcinolone Discussed reasons to  return to MAU Schedule f/u with gyn for pap smear & reassessment of symptoms  GC/CT pending   Judeth Hornrin Isabel Ardila 09/23/2016, 6:03 PM

## 2016-09-25 LAB — GC/CHLAMYDIA PROBE AMP (~~LOC~~) NOT AT ARMC
Chlamydia: NEGATIVE
Neisseria Gonorrhea: NEGATIVE

## 2019-07-01 DIAGNOSIS — N870 Mild cervical dysplasia: Secondary | ICD-10-CM | POA: Insufficient documentation

## 2020-01-20 ENCOUNTER — Encounter: Payer: Commercial Managed Care - PPO | Admitting: Family Medicine

## 2020-01-28 ENCOUNTER — Ambulatory Visit (INDEPENDENT_AMBULATORY_CARE_PROVIDER_SITE_OTHER): Payer: 59 | Admitting: Family Medicine

## 2020-01-28 ENCOUNTER — Encounter: Payer: Self-pay | Admitting: Family Medicine

## 2020-01-28 ENCOUNTER — Other Ambulatory Visit: Payer: Self-pay

## 2020-01-28 VITALS — BP 90/60 | HR 88 | Temp 97.7°F | Ht 60.0 in | Wt 94.0 lb

## 2020-01-28 DIAGNOSIS — Z23 Encounter for immunization: Secondary | ICD-10-CM

## 2020-01-28 DIAGNOSIS — N3010 Interstitial cystitis (chronic) without hematuria: Secondary | ICD-10-CM

## 2020-01-28 DIAGNOSIS — M791 Myalgia, unspecified site: Secondary | ICD-10-CM

## 2020-01-28 DIAGNOSIS — M2550 Pain in unspecified joint: Secondary | ICD-10-CM | POA: Insufficient documentation

## 2020-01-28 DIAGNOSIS — M549 Dorsalgia, unspecified: Secondary | ICD-10-CM

## 2020-01-28 DIAGNOSIS — R5383 Other fatigue: Secondary | ICD-10-CM | POA: Diagnosis not present

## 2020-01-28 DIAGNOSIS — M542 Cervicalgia: Secondary | ICD-10-CM | POA: Insufficient documentation

## 2020-01-28 LAB — POCT URINALYSIS DIP (CLINITEK)
Bilirubin, UA: NEGATIVE
Glucose, UA: NEGATIVE mg/dL
Ketones, POC UA: NEGATIVE mg/dL
Leukocytes, UA: NEGATIVE
Nitrite, UA: NEGATIVE
POC PROTEIN,UA: NEGATIVE
Spec Grav, UA: 1.01 (ref 1.010–1.025)
Urobilinogen, UA: NEGATIVE E.U./dL — AB
pH, UA: 7 (ref 5.0–8.0)

## 2020-01-28 MED ORDER — CYCLOBENZAPRINE HCL 5 MG PO TABS: 5.0000 mg | ORAL_TABLET | Freq: Three times a day (TID) | ORAL | 0 refills | Status: DC | PRN

## 2020-01-28 MED ORDER — HYDROXYZINE HCL 25 MG PO TABS: 25.0000 mg | ORAL_TABLET | Freq: Every evening | ORAL | 5 refills | Status: DC

## 2020-01-28 NOTE — Patient Instructions (Signed)
Increase water-electrolyte water  labwork-will call on results

## 2020-01-28 NOTE — Progress Notes (Signed)
Established Patient Office Visit  Subjective:  Patient ID: Melissa Ware, female    DOB: 12/17/1990  Age: 29 y.o. MRN: [id number]  CC: muscle pain HPI Melissa Ware presents for muscle aches-deeper muscle tenderness-especially in the upper arms. No pain with movement. NO loss of ROM.  No skin changes. Back in lower back-pt with h/o of IC-followed by urology.  Past Medical History:  Diagnosis Date  . Abnormal Pap smear   . Anxiety    Not on meds  . H/O varicella   . Panic attacks    Not on meds  . Vaginal Pap smear, abnormal     Past Surgical History:  Procedure Laterality Date  . APPENDECTOMY  2007  . CHOLECYSTECTOMY, LAPAROSCOPIC  2007  . GYNECOLOGIC CRYOSURGERY      Family History  Problem Relation Age of Onset  . Other Other        "allergic to anesthesia"  . Heart disease Father        sudden cardiac death  . Hypertension Mother   . Hyperlipidemia Mother   . Hypertension Maternal Grandmother   . Non-Hodgkin's lymphoma Maternal Grandmother   . Hypothyroidism Maternal Grandmother   . Hyperlipidemia Maternal Grandmother   . Diabetes Maternal Grandfather     Social History   Socioeconomic History  . Marital status: Married    Spouse name: Not on file  . Number of children: Not on file  . Years of education: Not on file  . Highest education level: Not on file  Occupational History  . Not on file  Tobacco Use  . Smoking status: Former Smoker    Packs/day: 0.50    Years: 0.50    Pack years: 0.25    Types: Cigarettes    Quit date: 03/30/2011    Years since quitting: 8.8  . Smokeless tobacco: Never Used  Vaping Use  . Vaping Use: Never used  Substance and Sexual Activity  . Alcohol use: No  . Drug use: No  . Sexual activity: Never  Other Topics Concern  . Not on file  Social History Narrative  . Not on file   Social Determinants of Health   Financial Resource Strain:   . Difficulty of Paying Living Expenses: Not on file  Food Insecurity:     . Worried About Charity fundraiser in the Last Year: Not on file  . Ran Out of Food in the Last Year: Not on file  Transportation Needs:   . Lack of Transportation (Medical): Not on file  . Lack of Transportation (Non-Medical): Not on file  Physical Activity:   . Days of Exercise per Week: Not on file  . Minutes of Exercise per Session: Not on file  Stress:   . Feeling of Stress : Not on file  Social Connections:   . Frequency of Communication with Friends and Family: Not on file  . Frequency of Social Gatherings with Friends and Family: Not on file  . Attends Religious Services: Not on file  . Active Member of Clubs or Organizations: Not on file  . Attends Archivist Meetings: Not on file  . Marital Status: Not on file  Intimate Partner Violence:   . Fear of Current or Ex-Partner: Not on file  . Emotionally Abused: Not on file  . Physically Abused: Not on file  . Sexually Abused: Not on file    Outpatient Medications Prior to Visit  Medication Sig Dispense Refill  . cyclobenzaprine (FLEXERIL) 5 MG tablet  Take 5 mg by mouth at bedtime as needed for muscle spasms.    . hydrOXYzine (ATARAX/VISTARIL) 25 MG tablet Take 1 tablet (25 mg total) by mouth every 6 (six) hours as needed for itching. (Patient taking differently: Take 25 mg by mouth at bedtime. ) 10 tablet 0  . ibuprofen (ADVIL,MOTRIN) 600 MG tablet Take 1 tablet (600 mg total) by mouth every 6 (six) hours. 30 tablet 1  . Prenatal Vit-Fe Fumarate-FA (PRENATAL MULTIVITAMIN) TABS Take 1 tablet by mouth at bedtime.     . triamcinolone cream (KENALOG) 0.1 % Apply 1 application topically 2 (two) times daily. 30 g 0   No facility-administered medications prior to visit.    Allergies  Allergen Reactions  . Onion Anaphylaxis and Swelling    Tongue swells and it becomes hard to breathe  . Peanut-Containing Drug Products Anaphylaxis and Swelling    Tongue swells and it becomes hard to breathe     ROS Review of  Systems  Constitutional: Positive for fatigue. Negative for fever.  Respiratory: Negative.   Gastrointestinal: Negative.   Genitourinary:       IC  Musculoskeletal: Positive for arthralgias, back pain and myalgias.  Neurological: Negative for headaches.      Objective:    Physical Exam Constitutional:      Appearance: Normal appearance.  HENT:     Head: Normocephalic and atraumatic.  Cardiovascular:     Rate and Rhythm: Normal rate and regular rhythm.     Pulses: Normal pulses.     Heart sounds: Normal heart sounds.  Pulmonary:     Effort: Pulmonary effort is normal.     Breath sounds: Normal breath sounds.  Abdominal:     General: Bowel sounds are normal.     Tenderness: There is right CVA tenderness.  Musculoskeletal:        General: Tenderness present.     Cervical back: Normal range of motion and neck supple.  Neurological:     Mental Status: She is alert and oriented to person, place, and time.     BP 90/60 (BP Location: Left Arm, Patient Position: Sitting)   Pulse 88   Temp 97.7 F (36.5 C) (Temporal)   Ht 5' (1.524 m)   Wt 94 lb (42.6 kg)   SpO2 97%   BMI 18.36 kg/m  Wt Readings from Last 3 Encounters:  01/28/20 94 lb (42.6 kg)  12/30/13 118 lb (53.5 kg)  12/18/13 115 lb 12.8 oz (52.5 kg)     Health Maintenance Due  Topic Date Due  . Hepatitis C Screening  Never done  . COVID-19 Vaccine (1) Never done  . TETANUS/TDAP  Never done  . PAP SMEAR-Modifier  Never done  . INFLUENZA VACCINE  11/23/2019     Lab Results  Component Value Date   WBC 12.3 (H) 12/31/2013   HGB 10.7 (L) 12/31/2013   HCT 31.1 (L) 12/31/2013   MCV 96.9 12/31/2013   PLT 221 12/31/2013     Assessment & Plan:  1. Acute right-sided back pain, unspecified back location Tenderness to palpation-CV-right. No rash, no injury - POCT URINALYSIS DIP (CLINITEK) - CBC with Differential/Platelet - CMP14+EGFR - Magnesium - VITAMIN D 25 Hydroxy (Vit-D Deficiency, Fractures)  2.  Myalgia, unspecified site Long term concern-worsening - CBC with Differential/Platelet - CMP14+EGFR - Magnesium - VITAMIN D 25 Hydroxy (Vit-D Deficiency, Fractures) Flexeril-rx 3. Arthralgia, unspecified joint Hydrate well-consider drinking electrolyte replacement water - CBC with Differential/Platelet - CMP14+EGFR - Magnesium - VITAMIN D   25 Hydroxy (Vit-D Deficiency, Fractures)  4. Other fatigue Electrolyte replacement discussed - CBC with Differential/Platelet - CMP14+EGFR - Magnesium - VITAMIN D 25 Hydroxy (Vit-D Deficiency, Fractures) Atarax-rx-difficulty sleeping worsening fatigue 5. IC (interstitial cystitis) Sees urology-concern for infection-blood noted  Follow-up:     LISA LEIGH CORUM, MD 

## 2020-01-29 LAB — CMP14+EGFR
ALT: 10 IU/L (ref 0–32)
AST: 12 IU/L (ref 0–40)
Albumin/Globulin Ratio: 2.1 (ref 1.2–2.2)
Albumin: 4.6 g/dL (ref 3.9–5.0)
Alkaline Phosphatase: 63 IU/L (ref 44–121)
BUN/Creatinine Ratio: 11 (ref 9–23)
BUN: 8 mg/dL (ref 6–20)
Bilirubin Total: 0.3 mg/dL (ref 0.0–1.2)
CO2: 27 mmol/L (ref 20–29)
Calcium: 9.5 mg/dL (ref 8.7–10.2)
Chloride: 104 mmol/L (ref 96–106)
Creatinine, Ser: 0.71 mg/dL (ref 0.57–1.00)
GFR calc Af Amer: 132 mL/min/{1.73_m2} (ref >59)
GFR calc non Af Amer: 115 mL/min/{1.73_m2} (ref >59)
Globulin, Total: 2.2 g/dL (ref 1.5–4.5)
Glucose: 73 mg/dL (ref 65–99)
Potassium: 4.6 mmol/L (ref 3.5–5.2)
Sodium: 141 mmol/L (ref 134–144)
Total Protein: 6.8 g/dL (ref 6.0–8.5)

## 2020-01-29 LAB — CBC WITH DIFFERENTIAL/PLATELET
Basophils Absolute: 0.1 10*3/uL (ref 0.0–0.2)
Basos: 1 %
EOS (ABSOLUTE): 0.2 10*3/uL (ref 0.0–0.4)
Eos: 2 %
Hematocrit: 40.2 % (ref 34.0–46.6)
Hemoglobin: 13.3 g/dL (ref 11.1–15.9)
Immature Grans (Abs): 0 10*3/uL (ref 0.0–0.1)
Immature Granulocytes: 0 %
Lymphocytes Absolute: 2.5 10*3/uL (ref 0.7–3.1)
Lymphs: 28 %
MCH: 32 pg (ref 26.6–33.0)
MCHC: 33.1 g/dL (ref 31.5–35.7)
MCV: 97 fL (ref 79–97)
Monocytes Absolute: 0.8 10*3/uL (ref 0.1–0.9)
Monocytes: 9 %
Neutrophils Absolute: 5.4 10*3/uL (ref 1.4–7.0)
Neutrophils: 60 %
Platelets: 350 10*3/uL (ref 150–450)
RBC: 4.15 x10E6/uL (ref 3.77–5.28)
RDW: 11.9 % (ref 11.7–15.4)
WBC: 9 10*3/uL (ref 3.4–10.8)

## 2020-01-29 LAB — MAGNESIUM: Magnesium: 2.2 mg/dL (ref 1.6–2.3)

## 2020-01-29 LAB — VITAMIN D 25 HYDROXY (VIT D DEFICIENCY, FRACTURES): Vit D, 25-Hydroxy: 31.8 ng/mL (ref 30.0–100.0)

## 2021-06-24 LAB — HM PAP SMEAR: HM Pap smear: NORMAL

## 2022-01-02 ENCOUNTER — Encounter: Payer: Self-pay | Admitting: Nurse Practitioner

## 2022-01-02 ENCOUNTER — Ambulatory Visit (INDEPENDENT_AMBULATORY_CARE_PROVIDER_SITE_OTHER): Payer: PRIVATE HEALTH INSURANCE | Admitting: Nurse Practitioner

## 2022-01-02 ENCOUNTER — Other Ambulatory Visit: Payer: Self-pay | Admitting: Nurse Practitioner

## 2022-01-02 VITALS — BP 106/74 | HR 81 | Temp 97.3°F | Ht 59.0 in | Wt 95.0 lb

## 2022-01-02 DIAGNOSIS — Z2821 Immunization not carried out because of patient refusal: Secondary | ICD-10-CM | POA: Diagnosis not present

## 2022-01-02 DIAGNOSIS — Z681 Body mass index (BMI) 19 or less, adult: Secondary | ICD-10-CM | POA: Diagnosis not present

## 2022-01-02 DIAGNOSIS — Z0000 Encounter for general adult medical examination without abnormal findings: Secondary | ICD-10-CM | POA: Diagnosis not present

## 2022-01-02 MED ORDER — CYCLOBENZAPRINE HCL 5 MG PO TABS: 5.0000 mg | ORAL_TABLET | Freq: Three times a day (TID) | ORAL | 0 refills | Status: DC | PRN

## 2022-01-02 NOTE — Progress Notes (Signed)
Subjective:  Patient ID: Melissa Ware, female    DOB: 1991/02/10  Age: 31 y.o. MRN: [id number]  Chief Complaint  Patient presents with   Annual Exam    HPI Encounter for general adult medical examination without abnormal findings  Physical ("At Risk" items are starred): Patient's last physical exam was 1 year ago .      SDOH Screenings   Food Insecurity: No Food Insecurity (01/02/2022)  Housing: Low Risk  (01/02/2022)  Transportation Needs: No Transportation Needs (01/02/2022)  Utilities: Not At Risk (01/02/2022)  Alcohol Screen: Low Risk  (01/02/2022)  Depression (PHQ2-9): Low Risk  (01/02/2022)  Financial Resource Strain: Low Risk  (01/02/2022)  Physical Activity: Inactive (01/02/2022)  Social Connections: Moderately Isolated (01/02/2022)  Stress: No Stress Concern Present (01/02/2022)  Tobacco Use: Medium Risk (01/02/2022)       01/02/2022   11:35 AM  Fall Risk   Falls in the past year? 0  Number falls in past yr: 0  Injury with Fall? 0  Risk for fall due to : No Fall Risks  Follow up Falls evaluation completed       01/02/2022   11:35 AM  Depression screen PHQ 2/9  Decreased Interest 0  Down, Depressed, Hopeless 0  PHQ - 2 Score 0  Altered sleeping 0  Tired, decreased energy 0  Change in appetite 0  Feeling bad or failure about yourself  0  Trouble concentrating 0  Moving slowly or fidgety/restless 0  Suicidal thoughts 0  PHQ-9 Score 0  Difficult doing work/chores Not difficult at all    Functional Status Survey: Is the patient deaf or have difficulty hearing?: No Does the patient have difficulty seeing, even when wearing glasses/contacts?: No Does the patient have difficulty concentrating, remembering, or making decisions?: No Does the patient have difficulty walking or climbing stairs?: No Does the patient have difficulty dressing or bathing?: No Does the patient have difficulty doing errands alone such as visiting a doctor's office or shopping?: No    Safety: reviewed ;  Patient wears a seat belt. Patient's home has smoke detectors and carbon monoxide detectors. Patient practices appropriate gun safety Patient wears sunscreen with extended sun exposure. Dental Care: biannual cleanings, brushes and flosses daily. Ophthalmology/Optometry: Annual visit.  Hearing loss: none Vision impairments: yes, wears corrective lenses, eye exam this year Patient is not afflicted from Stress Incontinence and Urge Incontinence   Current Outpatient Medications on File Prior to Visit  Medication Sig Dispense Refill   cyclobenzaprine (FLEXERIL) 5 MG tablet Take 1 tablet (5 mg total) by mouth 3 (three) times daily as needed for muscle spasms. 30 tablet 0   No current facility-administered medications on file prior to visit.    Social Hx   Social History   Socioeconomic History   Marital status: Married    Spouse name: Not on file   Number of children: Not on file   Years of education: Not on file   Highest education level: Not on file  Occupational History   Not on file  Tobacco Use   Smoking status: Former    Packs/day: 0.50    Years: 0.50    Total pack years: 0.25    Types: Cigarettes    Quit date: 03/30/2011    Years since quitting: 10.7   Smokeless tobacco: Never  Vaping Use   Vaping Use: Never used  Substance and Sexual Activity   Alcohol use: No   Drug use: No   Sexual activity: Never  Other  Topics Concern   Not on file  Social History Narrative   Not on file   Social Determinants of Health   Financial Resource Strain: Low Risk  (01/02/2022)   Overall Financial Resource Strain (CARDIA)    Difficulty of Paying Living Expenses: Not hard at all  Food Insecurity: No Food Insecurity (01/02/2022)   Hunger Vital Sign    Worried About Running Out of Food in the Last Year: Never true    Ran Out of Food in the Last Year: Never true  Transportation Needs: No Transportation Needs (01/02/2022)   PRAPARE - Scientist, research (physical sciences) (Medical): No    Lack of Transportation (Non-Medical): No  Physical Activity: Inactive (01/02/2022)   Exercise Vital Sign    Days of Exercise per Week: 0 days    Minutes of Exercise per Session: 0 min  Stress: No Stress Concern Present (01/02/2022)   [employer] of Occupational Health - Occupational Stress Questionnaire    Feeling of Stress : Not at all  Social Connections: Moderately Isolated (01/02/2022)   Social Connection and Isolation Panel [NHANES]    Frequency of Communication with Friends and Family: More than three times a week    Frequency of Social Gatherings with Friends and Family: More than three times a week    Attends Religious Services: Never    Database administrator or Organizations: No    Attends Engineer, structural: Never    Marital Status: Married   Past Medical History:  Diagnosis Date   Abnormal Pap smear    Anxiety    Not on meds   H/O varicella    Panic attacks    Not on meds   Vaginal Pap smear, abnormal    Family History  Problem Relation Age of Onset   Other Other        "allergic to anesthesia"   Heart disease Father        sudden cardiac death   Hypertension Mother    Hyperlipidemia Mother    Hypertension Maternal Grandmother    Non-Hodgkin's lymphoma Maternal Grandmother    Hypothyroidism Maternal Grandmother    Hyperlipidemia Maternal Grandmother    Diabetes Maternal Grandfather     Review of Systems  Constitutional:  Negative for chills, fatigue and fever.  HENT:  Negative for congestion, ear pain, rhinorrhea and sore throat.   Respiratory:  Negative for cough and shortness of breath.   Cardiovascular:  Negative for chest pain.  Gastrointestinal:  Negative for abdominal pain, constipation, diarrhea, nausea and vomiting.  Genitourinary:  Negative for dysuria and urgency.  Musculoskeletal:  Positive for neck pain (chronic). Negative for back pain and myalgias.  Neurological:  Negative for dizziness,  weakness, light-headedness and headaches.  Psychiatric/Behavioral:  Negative for dysphoric mood. The patient is not nervous/anxious.      Objective:  BP 106/74   Pulse 81   Temp (!) 97.3 F (36.3 C)   Ht 4\' 11"  (1.499 m)   Wt 95 lb (43.1 kg)   LMP 12/26/2021   SpO2 98%   BMI 19.19 kg/m      01/02/2022   11:34 AM 01/28/2020    3:11 PM 09/23/2016    7:16 PM  BP/Weight  Systolic BP 106 90 122  Diastolic BP 74 60 79  Wt. (Lbs) 95 94   BMI 19.19 kg/m2 18.36 kg/m2     Physical Exam Vitals reviewed.  Constitutional:      Appearance: Normal appearance.  HENT:  Right Ear: Tympanic membrane normal.     Left Ear: Tympanic membrane normal.     Nose: Nose normal.     Mouth/Throat:     Pharynx: No oropharyngeal exudate or posterior oropharyngeal erythema.  Eyes:     Conjunctiva/sclera: Conjunctivae normal.  Neck:     Vascular: No carotid bruit.  Cardiovascular:     Rate and Rhythm: Normal rate and regular rhythm.     Pulses: Normal pulses.     Heart sounds: Normal heart sounds.  Pulmonary:     Effort: Pulmonary effort is normal.     Breath sounds: Normal breath sounds.  Abdominal:     General: Bowel sounds are normal.     Palpations: There is no mass.     Tenderness: There is no abdominal tenderness.  Musculoskeletal:     Cervical back: Normal range of motion.  Skin:    Findings: No lesion.  Neurological:     Mental Status: She is alert and oriented to person, place, and time.  Psychiatric:        Mood and Affect: Mood normal.        Behavior: Behavior normal.     Lab Results  Component Value Date   WBC 9.0 01/28/2020   HGB 13.3 01/28/2020   HCT 40.2 01/28/2020   PLT 350 01/28/2020   GLUCOSE 73 01/28/2020   ALT 10 01/28/2020   AST 12 01/28/2020   NA 141 01/28/2020   K 4.6 01/28/2020   CL 104 01/28/2020   CREATININE 0.71 01/28/2020   BUN 8 01/28/2020   CO2 27 01/28/2020      Assessment & Plan:     1. Routine medical exam - CBC with  Differential/Platelet - Comprehensive metabolic panel - T4, free - TSH  2. Body mass index (BMI) of 19.0 to 19.9 in adult - CBC with Differential/Platelet - Comprehensive metabolic panel - T4, free - TSH  3. Refused influenza vaccine     These are the goals we discussed:  Goals   Stay healthy      This is a list of the screening recommended for you and due dates:  Health Maintenance  Topic Date Due   Flu Shot  07/23/2022*   Tetanus Vaccine  01/03/2023*   Pap Smear  06/24/2024   HIV Screening  Completed   HPV Vaccine  Aged Out   COVID-19 Vaccine  Discontinued  *Topic was postponed. The date shown is not the original due date.      AN INDIVIDUALIZED CARE PLAN: was established or reinforced today.   SELF MANAGEMENT: The patient and I together assessed ways to personally work towards obtaining the recommended goals  Support needs The patient and/or family needs were assessed and services were offered and not necessary at this time.    Follow-up: 1-year for CPE   Signed, [facility] [phone]

## 2022-01-02 NOTE — Patient Instructions (Signed)
We will call you with lab results Return for evaluation of chronic neck pain Follow-up physical exam in 1 year  Preventive Care 32-32 Years Old, Female Preventive care refers to lifestyle choices and visits with your health care provider that can promote health and wellness. Preventive care visits are also called wellness exams. What can I expect for my preventive care visit? Counseling During your preventive care visit, your health care provider may ask about your: Medical history, including: Past medical problems. Family medical history. Pregnancy history. Current health, including: Menstrual cycle. Method of birth control. Emotional well-being. Home life and relationship well-being. Sexual activity and sexual health. Lifestyle, including: Alcohol, nicotine or tobacco, and drug use. Access to firearms. Diet, exercise, and sleep habits. Work and work Statistician. Sunscreen use. Safety issues such as seatbelt and bike helmet use. Physical exam Your health care provider may check your: Height and weight. These may be used to calculate your BMI (body mass index). BMI is a measurement that tells if you are at a healthy weight. Waist circumference. This measures the distance around your waistline. This measurement also tells if you are at a healthy weight and may help predict your risk of certain diseases, such as type 2 diabetes and high blood pressure. Heart rate and blood pressure. Body temperature. Skin for abnormal spots. What immunizations do I need?  Vaccines are usually given at various ages, according to a schedule. Your health care provider will recommend vaccines for you based on your age, medical history, and lifestyle or other factors, such as travel or where you work. What tests do I need? Screening Your health care provider may recommend screening tests for certain conditions. This may include: Pelvic exam and Pap test. Lipid and cholesterol levels. Diabetes  screening. This is done by checking your blood sugar (glucose) after you have not eaten for a while (fasting). Hepatitis B test. Hepatitis C test. HIV (human immunodeficiency virus) test. STI (sexually transmitted infection) testing, if you are at risk. BRCA-related cancer screening. This may be done if you have a family history of breast, ovarian, tubal, or peritoneal cancers. Talk with your health care provider about your test results, treatment options, and if necessary, the need for more tests. Follow these instructions at home: Eating and drinking  Eat a healthy diet that includes fresh fruits and vegetables, whole grains, lean protein, and low-fat dairy products. Take vitamin and mineral supplements as recommended by your health care provider. Do not drink alcohol if: Your health care provider tells you not to drink. You are pregnant, may be pregnant, or are planning to become pregnant. If you drink alcohol: Limit how much you have to 0-1 drink a day. Know how much alcohol is in your drink. In the [place] one drink equals one 12 oz bottle of beer (355 mL), one 5 oz glass of wine (148 mL), or one 1 oz glass of hard liquor (44 mL). Lifestyle Brush your teeth every morning and night with fluoride toothpaste. Floss one time each day. Exercise for at least 30 minutes 5 or more days each week. Do not use any products that contain nicotine or tobacco. These products include cigarettes, chewing tobacco, and vaping devices, such as e-cigarettes. If you need help quitting, ask your health care provider. Do not use drugs. If you are sexually active, practice safe sex. Use a condom or other form of protection to prevent STIs. If you do not wish to become pregnant, use a form of birth control. If you plan  to become pregnant, see your health care provider for a prepregnancy visit. Find healthy ways to manage stress, such as: Meditation, yoga, or listening to music. Journaling. Talking to a trusted  person. Spending time with friends and family. Minimize exposure to UV radiation to reduce your risk of skin cancer. Safety Always wear your seat belt while driving or riding in a vehicle. Do not drive: If you have been drinking alcohol. Do not ride with someone who has been drinking. If you have been using any mind-altering substances or drugs. While texting. When you are tired or distracted. Wear a helmet and other protective equipment during sports activities. If you have firearms in your house, make sure you follow all gun safety procedures. Seek help if you have been physically or sexually abused. What's next? Go to your health care provider once a year for an annual wellness visit. Ask your health care provider how often you should have your eyes and teeth checked. Stay up to date on all vaccines. This information is not intended to replace advice given to you by your health care provider. Make sure you discuss any questions you have with your health care provider. Document Revised: 10/06/2020 Document Reviewed: 10/06/2020 Elsevier Patient Education  [employer].

## 2022-01-03 LAB — CBC WITH DIFFERENTIAL/PLATELET
Basophils Absolute: 0.1 10*3/uL (ref 0.0–0.2)
Basos: 1 %
EOS (ABSOLUTE): 0.1 10*3/uL (ref 0.0–0.4)
Eos: 1 %
Hematocrit: 38.4 % (ref 34.0–46.6)
Hemoglobin: 13.1 g/dL (ref 11.1–15.9)
Immature Grans (Abs): 0 10*3/uL (ref 0.0–0.1)
Immature Granulocytes: 0 %
Lymphocytes Absolute: 1.8 10*3/uL (ref 0.7–3.1)
Lymphs: 29 %
MCH: 32.6 pg (ref 26.6–33.0)
MCHC: 34.1 g/dL (ref 31.5–35.7)
MCV: 96 fL (ref 79–97)
Monocytes Absolute: 0.5 10*3/uL (ref 0.1–0.9)
Monocytes: 8 %
Neutrophils Absolute: 3.9 10*3/uL (ref 1.4–7.0)
Neutrophils: 61 %
Platelets: 351 10*3/uL (ref 150–450)
RBC: 4.02 x10E6/uL (ref 3.77–5.28)
RDW: 11.9 % (ref 11.7–15.4)
WBC: 6.4 10*3/uL (ref 3.4–10.8)

## 2022-01-03 LAB — TSH: TSH: 3.37 u[IU]/mL (ref 0.450–4.500)

## 2022-01-03 LAB — COMPREHENSIVE METABOLIC PANEL
ALT: 10 IU/L (ref 0–32)
AST: 17 IU/L (ref 0–40)
Albumin/Globulin Ratio: 1.9 (ref 1.2–2.2)
Albumin: 4.7 g/dL (ref 3.9–4.9)
Alkaline Phosphatase: 45 IU/L (ref 44–121)
BUN/Creatinine Ratio: 10 (ref 9–23)
BUN: 9 mg/dL (ref 6–20)
Bilirubin Total: 0.3 mg/dL (ref 0.0–1.2)
CO2: 23 mmol/L (ref 20–29)
Calcium: 9.9 mg/dL (ref 8.7–10.2)
Chloride: 101 mmol/L (ref 96–106)
Creatinine, Ser: 0.91 mg/dL (ref 0.57–1.00)
Globulin, Total: 2.5 g/dL (ref 1.5–4.5)
Glucose: 73 mg/dL (ref 70–99)
Potassium: 5.1 mmol/L (ref 3.5–5.2)
Sodium: 139 mmol/L (ref 134–144)
Total Protein: 7.2 g/dL (ref 6.0–8.5)
eGFR: 86 mL/min/{1.73_m2} (ref >59)

## 2022-01-03 LAB — T4, FREE: Free T4: 1.21 ng/dL (ref 0.82–1.77)

## 2022-01-09 ENCOUNTER — Ambulatory Visit: Payer: PRIVATE HEALTH INSURANCE | Admitting: Nurse Practitioner

## 2022-01-16 ENCOUNTER — Ambulatory Visit: Payer: PRIVATE HEALTH INSURANCE | Admitting: Nurse Practitioner

## 2022-02-14 ENCOUNTER — Encounter: Payer: Self-pay | Admitting: Nurse Practitioner

## 2022-09-05 ENCOUNTER — Telehealth: Payer: Self-pay

## 2022-09-05 NOTE — Telephone Encounter (Signed)
Patient's husband called stating that the patient was needing an appointment and went into detail stating that over a 3 week period the patient has experienced a miscarriage, and also stopped taking her Lamictal cold [place] and has had thoughts of suicide this past Friday. Patient has been seeing Psychiatry for a while and recently found out that the provider she has been seeing is no longer in her network and has to find a new one. Consulted with Dr. Sedalia Muta since there is no availability to work patient in this week and patient needs to be seen by someone. Per Dr. Sedalia Muta patient needs to go to the Crisis center at [facility] to be evaluated and see if they can link her with a psychiatrist to figure out what needs to be done about the medications. Contacted patient's husband back and informed him due to the medication being stopped and the bad thoughts it is critical for the patient to seek Psych emergently and the crisis center in [place] at [facility] is what is recommended. Patient's husband understood verbally and stated he would get the patient there.

## 2022-11-20 NOTE — Progress Notes (Unsigned)
Subjective:  Patient ID: Melissa Ware, female    DOB: [birth year]  Age: 32 y.o. MRN: [id number]  No chief complaint on file.   HPI Well Adult Physical: Patient here for a comprehensive physical exam.The patient reports {problems:16946} Do you take any herbs or supplements that were not prescribed by a doctor? {yes/no/not asked:9010} Are you taking calcium supplements? {yes/no:63} Are you taking aspirin daily? {yes/no:63}  Encounter for general adult medical examination without abnormal findings  Physical ("At Risk" items are starred): Patient's last physical exam was 1 year ago .  Patient is not afflicted from Stress Incontinence and Urge Incontinence  Patient wears a seat belts Patient has smoke detectors and has carbon monoxide detectors. Patient practices appropriate gun safety. Patient wears sunscreen with extended sun exposure. Dental Care: biannual cleanings, brushes and flosses daily. Ophthalmology/Optometry: Annual visit.  Hearing loss: none Vision impairments: none  Menarche: *** Menstrual History: *** LMP: *** Pregnancy history: *** Safe at home: *** Self breast exams: ***     01/02/2022   11:35 AM  Depression screen PHQ 2/9  Decreased Interest 0  Down, Depressed, Hopeless 0  PHQ - 2 Score 0  Altered sleeping 0  Tired, decreased energy 0  Change in appetite 0  Feeling bad or failure about yourself  0  Trouble concentrating 0  Moving slowly or fidgety/restless 0  Suicidal thoughts 0  PHQ-9 Score 0  Difficult doing work/chores Not difficult at all         01/02/2022   11:35 AM  Fall Risk  Falls in the past year? 0  Was there an injury with Fall? 0  Fall Risk Category Calculator 0  Fall Risk Category (Retired) Low  (RETIRED) Patient Fall Risk Level Low fall risk  Patient at Risk for Falls Due to No Fall Risks  Fall risk Follow up Falls evaluation completed             Social Hx   Social History   Socioeconomic History   Marital status:  Married    Spouse name: Not on file   Number of children: Not on file   Years of education: Not on file   Highest education level: Not on file  Occupational History   Not on file  Tobacco Use   Smoking status: Former    Current packs/day: 0.00    Average packs/day: 0.5 packs/day for 0.5 years (0.2 ttl pk-yrs)    Types: Cigarettes    Start date: 09/29/2010    Quit date: 03/30/2011    Years since quitting: 11.6   Smokeless tobacco: Never  Vaping Use   Vaping status: Never Used  Substance and Sexual Activity   Alcohol use: No   Drug use: No   Sexual activity: Never  Other Topics Concern   Not on file  Social History Narrative   Not on file   Social Determinants of Health   Financial Resource Strain: Low Risk  (01/02/2022)   Overall Financial Resource Strain (CARDIA)    Difficulty of Paying Living Expenses: Not hard at all  Food Insecurity: No Food Insecurity (01/02/2022)   Hunger Vital Sign    Worried About Running Out of Food in the Last Year: Never true    Ran Out of Food in the Last Year: Never true  Transportation Needs: No Transportation Needs (01/02/2022)   PRAPARE - Administrator, Civil Service (Medical): No    Lack of Transportation (Non-Medical): No  Physical Activity: Inactive (01/02/2022)   Exercise  Vital Sign    Days of Exercise per Week: 0 days    Minutes of Exercise per Session: 0 min  Stress: No Stress Concern Present (01/02/2022)   [employer] of Occupational Health - Occupational Stress Questionnaire    Feeling of Stress : Not at all  Social Connections: Moderately Isolated (01/02/2022)   Social Connection and Isolation Panel [NHANES]    Frequency of Communication with Friends and Family: More than three times a week    Frequency of Social Gatherings with Friends and Family: More than three times a week    Attends Religious Services: Never    Database administrator or Organizations: No    Attends Engineer, structural: Never     Marital Status: Married   Past Medical History:  Diagnosis Date   Abnormal Pap smear    Anxiety    Not on meds   H/O varicella    Panic attacks    Not on meds   Vaginal Pap smear, abnormal    Past Surgical History:  Procedure Laterality Date   APPENDECTOMY  2007   CHOLECYSTECTOMY, LAPAROSCOPIC  2007   GYNECOLOGIC CRYOSURGERY      Family History  Problem Relation Age of Onset   Other Other        "allergic to anesthesia"   Heart disease Father        sudden cardiac death   Hypertension Mother    Hyperlipidemia Mother    Hypertension Maternal Grandmother    Non-Hodgkin's lymphoma Maternal Grandmother    Hypothyroidism Maternal Grandmother    Hyperlipidemia Maternal Grandmother    Diabetes Maternal Grandfather     Review of Systems   Objective:  There were no vitals taken for this visit.     01/02/2022   11:34 AM 01/28/2020    3:11 PM 09/23/2016    7:16 PM  BP/Weight  Systolic BP 106 90 122  Diastolic BP 74 60 79  Wt. (Lbs) 95 94   BMI 19.19 kg/m2 18.36 kg/m2     Physical Exam  Lab Results  Component Value Date   WBC 6.4 01/02/2022   HGB 13.1 01/02/2022   HCT 38.4 01/02/2022   PLT 351 01/02/2022   GLUCOSE 73 01/02/2022   ALT 10 01/02/2022   AST 17 01/02/2022   NA 139 01/02/2022   K 5.1 01/02/2022   CL 101 01/02/2022   CREATININE 0.91 01/02/2022   BUN 9 01/02/2022   CO2 23 01/02/2022   TSH 3.370 01/02/2022      Assessment & Plan:  There are no diagnoses linked to this encounter.   There is no height or weight on file to calculate BMI.   These are the goals we discussed:  Goals   None      This is a list of the screening recommended for you and due dates:  Health Maintenance  Topic Date Due   DTaP/Tdap/Td vaccine (1 - Tdap) Never done   Flu Shot  11/23/2022   Pap Smear  06/24/2024   HIV Screening  Completed   HPV Vaccine  Aged Out   COVID-19 Vaccine  Discontinued     No orders of the defined types were placed in this  encounter.   Follow-up: No follow-ups on file.  An After Visit Summary was printed and given to the patient.  Langley Gauss, [facility] [phone]

## 2022-11-21 ENCOUNTER — Encounter: Payer: PRIVATE HEALTH INSURANCE | Admitting: Physician Assistant

## 2022-11-21 DIAGNOSIS — R509 Fever, unspecified: Secondary | ICD-10-CM | POA: Diagnosis not present

## 2022-11-21 DIAGNOSIS — B349 Viral infection, unspecified: Secondary | ICD-10-CM | POA: Diagnosis not present

## 2022-11-23 ENCOUNTER — Telehealth: Payer: Commercial Managed Care - PPO | Admitting: Physician Assistant

## 2022-11-23 DIAGNOSIS — B9689 Other specified bacterial agents as the cause of diseases classified elsewhere: Secondary | ICD-10-CM

## 2022-11-23 DIAGNOSIS — J019 Acute sinusitis, unspecified: Secondary | ICD-10-CM

## 2022-11-23 MED ORDER — AMOXICILLIN-POT CLAVULANATE 875-125 MG PO TABS: 1.0000 | ORAL_TABLET | Freq: Two times a day (BID) | ORAL | 0 refills | Status: DC

## 2022-11-23 NOTE — Progress Notes (Signed)

## 2022-11-23 NOTE — Progress Notes (Signed)
I have spent 5 minutes in review of e-visit questionnaire, review and updating patient chart, medical decision making and response to patient.   William Cody Martin, PA-C    

## 2022-12-04 DIAGNOSIS — R350 Frequency of micturition: Secondary | ICD-10-CM | POA: Diagnosis not present

## 2022-12-04 DIAGNOSIS — Z01411 Encounter for gynecological examination (general) (routine) with abnormal findings: Secondary | ICD-10-CM | POA: Diagnosis not present

## 2022-12-04 DIAGNOSIS — Z124 Encounter for screening for malignant neoplasm of cervix: Secondary | ICD-10-CM | POA: Diagnosis not present

## 2022-12-04 DIAGNOSIS — N761 Subacute and chronic vaginitis: Secondary | ICD-10-CM | POA: Diagnosis not present

## 2022-12-04 NOTE — Progress Notes (Signed)
  This encounter was created in error - please disregard. No show 

## 2022-12-08 ENCOUNTER — Encounter (HOSPITAL_COMMUNITY): Payer: Self-pay

## 2022-12-08 ENCOUNTER — Emergency Department (HOSPITAL_COMMUNITY)
Admission: EM | Admit: 2022-12-08 | Discharge: 2022-12-09 | Disposition: A | Discharge status: Home / Self Care | Payer: Commercial Managed Care - PPO | Attending: Emergency Medicine | Admitting: Emergency Medicine

## 2022-12-08 ENCOUNTER — Other Ambulatory Visit: Payer: Self-pay

## 2022-12-08 DIAGNOSIS — Z91010 Allergy to peanuts: Secondary | ICD-10-CM | POA: Insufficient documentation

## 2022-12-08 DIAGNOSIS — R102 Pelvic and perineal pain: Secondary | ICD-10-CM | POA: Insufficient documentation

## 2022-12-08 HISTORY — DX: Interstitial cystitis (chronic) without hematuria: N30.10

## 2022-12-08 LAB — COMPREHENSIVE METABOLIC PANEL
ALT: 18 U/L (ref 0–44)
AST: 20 U/L (ref 15–41)
Albumin: 3.9 g/dL (ref 3.5–5.0)
Alkaline Phosphatase: 44 U/L (ref 38–126)
Anion gap: 7 (ref 5–15)
BUN: 8 mg/dL (ref 6–20)
CO2: 26 mmol/L (ref 22–32)
Calcium: 9.2 mg/dL (ref 8.9–10.3)
Chloride: 105 mmol/L (ref 98–111)
Creatinine, Ser: 1.05 mg/dL — ABNORMAL HIGH (ref 0.44–1.00)
GFR, Estimated: >60 mL/min (ref >60)
Glucose, Bld: 105 mg/dL — ABNORMAL HIGH (ref 70–99)
Potassium: 4.4 mmol/L (ref 3.5–5.1)
Sodium: 138 mmol/L (ref 135–145)
Total Bilirubin: 0.2 mg/dL — ABNORMAL LOW (ref 0.3–1.2)
Total Protein: 6.6 g/dL (ref 6.5–8.1)

## 2022-12-08 LAB — URINALYSIS, ROUTINE W REFLEX MICROSCOPIC
Glucose, UA: NEGATIVE mg/dL
Ketones, ur: NEGATIVE mg/dL
Nitrite: NEGATIVE
Protein, ur: NEGATIVE mg/dL
Specific Gravity, Urine: 1.02 (ref 1.005–1.030)
pH: 6 (ref 5.0–8.0)

## 2022-12-08 LAB — CBC
HCT: 35.8 % — ABNORMAL LOW (ref 36.0–46.0)
Hemoglobin: 12.1 g/dL (ref 12.0–15.0)
MCH: 32.3 pg (ref 26.0–34.0)
MCHC: 33.8 g/dL (ref 30.0–36.0)
MCV: 95.5 fL (ref 80.0–100.0)
Platelets: 394 10*3/uL (ref 150–400)
RBC: 3.75 MIL/uL — ABNORMAL LOW (ref 3.87–5.11)
RDW: 11.9 % (ref 11.5–15.5)
WBC: 8.6 10*3/uL (ref 4.0–10.5)
nRBC: 0 % (ref 0.0–0.2)

## 2022-12-08 LAB — LIPASE, BLOOD: Lipase: 33 U/L (ref 11–51)

## 2022-12-08 LAB — HCG, SERUM, QUALITATIVE: Preg, Serum: NEGATIVE

## 2022-12-08 NOTE — ED Triage Notes (Signed)
4 days with lower pelvic pain that starts in the lower abdominal area and radiates to the back. On Monday started flagyl after being dx with BV. Denies fevers pt states she is a little weak throughout the day and has not been eating

## 2022-12-09 ENCOUNTER — Emergency Department (HOSPITAL_COMMUNITY): Payer: Commercial Managed Care - PPO

## 2022-12-09 MED ORDER — OXYCODONE-ACETAMINOPHEN 5-325 MG PO TABS
1.0000 | ORAL_TABLET | Freq: Once | ORAL | Status: AC
  Administered 2022-12-09: 1 via ORAL
  Filled 2022-12-09: qty 1

## 2022-12-09 MED ORDER — HYDROCODONE-ACETAMINOPHEN 5-325 MG PO TABS: 1.0000 | ORAL_TABLET | ORAL | 0 refills | Status: DC | PRN

## 2022-12-09 NOTE — ED Provider Notes (Signed)
[place] EMERGENCY DEPARTMENT AT [facility] Provider Note   CSN: [id number] Arrival date & time: 12/08/22  1930     History  Chief Complaint  Patient presents with   Abdominal Pain    Melissa Ware is a 32 y.o. female.  The history is provided by the patient and medical records.  Abdominal Pain  32 year old female with history of interstitial cystitis, anxiety, presenting to the ED with pelvic pain.  Saw her OB/GYN on Monday for routine Pap smear and was told that she had BV.  She was started on Flagyl which she has been taking as directed but over the past few days has had worsening pelvic pain.  States she feels this on both sides, varies which is worse.  She has had some nausea but denies any vomiting.  She has had decreased oral intake.  No diarrhea.  She has constant dysuria due to her IC, this is unchanged.  No fever or chills.  No flank pain.  Home Medications Prior to Admission medications   Medication Sig Start Date End Date Taking? Authorizing Provider  HYDROcodone-acetaminophen (NORCO/VICODIN) 5-325 MG tablet Take 1 tablet by mouth every 4 (four) hours as needed. 12/09/22  Yes Garlon Hatchet, PA-C  amoxicillin-clavulanate (AUGMENTIN) 875-125 MG tablet Take 1 tablet by mouth 2 (two) times daily. 11/23/22   Waldon Merl, PA-C  cyclobenzaprine (FLEXERIL) 5 MG tablet Take 1 tablet (5 mg total) by mouth 3 (three) times daily as needed for muscle spasms. 01/02/22   Janie Morning, NP      Allergies    Onion, Peanut oil, and Peanut-containing drug products    Review of Systems   Review of Systems  Gastrointestinal:  Positive for abdominal pain.  Genitourinary:  Positive for pelvic pain.  All other systems reviewed and are negative.   Physical Exam Updated Vital Signs BP (!) 97/53   Pulse 67   Temp 98.1 F (36.7 C)   Resp 18   Ht 5' (1.524 m)   Wt 42.6 kg   LMP 11/25/2022 (Exact Date)   SpO2 100%   BMI 18.36 kg/m  Physical Exam Vitals and  nursing note reviewed.  Constitutional:      Appearance: She is well-developed.  HENT:     Head: Normocephalic and atraumatic.  Eyes:     Conjunctiva/sclera: Conjunctivae normal.     Pupils: Pupils are equal, round, and reactive to light.  Cardiovascular:     Rate and Rhythm: Normal rate and regular rhythm.     Heart sounds: Normal heart sounds.  Pulmonary:     Effort: Pulmonary effort is normal.     Breath sounds: Normal breath sounds.  Abdominal:     General: Bowel sounds are normal.     Palpations: Abdomen is soft.     Tenderness: There is no abdominal tenderness. There is no guarding or rebound.  Musculoskeletal:        General: Normal range of motion.     Cervical back: Normal range of motion.  Skin:    General: Skin is warm and dry.  Neurological:     Mental Status: She is alert and oriented to person, place, and time.     ED Results / Procedures / Treatments   Labs (all labs ordered are listed, but only abnormal results are displayed) Labs Reviewed  COMPREHENSIVE METABOLIC PANEL - Abnormal; Notable for the following components:      Result Value   Glucose, Bld 105 (*)  Creatinine, Ser 1.05 (*)    Total Bilirubin 0.2 (*)    All other components within normal limits  CBC - Abnormal; Notable for the following components:   RBC 3.75 (*)    HCT 35.8 (*)    All other components within normal limits  URINALYSIS, ROUTINE W REFLEX MICROSCOPIC - Abnormal; Notable for the following components:   APPearance HAZY (*)    Hgb urine dipstick SMALL (*)    Bilirubin Urine SMALL (*)    Leukocytes,Ua TRACE (*)    Bacteria, UA RARE (*)    All other components within normal limits  LIPASE, BLOOD  HCG, SERUM, QUALITATIVE    EKG None  Radiology US PELVIC COMPLETE W TRANSVAGINAL AND TORSION R/O  Result Date: 12/09/2022 CLINICAL DATA:  Pelvic pain, LMP 12/05/2022. EXAM: TRANSABDOMINAL AND TRANSVAGINAL ULTRASOUND OF PELVIS DOPPLER ULTRASOUND OF OVARIES TECHNIQUE: Both  transabdominal and transvaginal ultrasound examinations of the pelvis were performed. Transabdominal technique was performed for global imaging of the pelvis including uterus, ovaries, adnexal regions, and pelvic cul-de-sac. It was necessary to proceed with endovaginal exam following the transabdominal exam to visualize the endometrium and ovaries bilaterally. Color and duplex Doppler ultrasound was utilized to evaluate blood flow to the ovaries. COMPARISON:  None Available. FINDINGS: Uterus Measurements: 7.4 x 3.4 x 5.6 cm = volume: 75 mL. No fibroids or other mass visualized. Endometrium Thickness: 7 mm.  No focal abnormality visualized. Right ovary Measurements: 2.3 x 1.6 x 2.4 cm = volume: 4 mL. Normal appearance/no adnexal mass. Left ovary Measurements: 2.5 x 1.2 x 2.8 cm = volume: 4 mL. Normal appearance/no adnexal mass. Pulsed Doppler evaluation of both ovaries demonstrates normal low-resistance arterial and venous waveforms. Other findings No abnormal free fluid. IMPRESSION: 1. Normal pelvic sonogram. Electronically Signed   By: Helyn Numbers M.D.   On: 12/09/2022 03:56    Procedures Procedures    Medications Ordered in ED Medications  oxyCODONE-acetaminophen (PERCOCET/ROXICET) 5-325 MG per tablet 1 tablet (1 tablet Oral Given 12/09/22 0227)    ED Course/ Medical Decision Making/ A&P                                 Medical Decision Making Amount and/or Complexity of Data Reviewed Labs: ordered. Radiology: ordered and independent interpretation performed. ECG/medicine tests: ordered and independent interpretation performed.  Risk Prescription drug management.   32 year old female presenting to the ED with pelvic pain.  She was seen by gynecology on Monday for routine Pap smear and told she had BV, started on Flagyl.  She has had some nausea with this but pelvic pain seems to be worsening.  She is afebrile and nontoxic in appearance.  Her abdomen is soft and nontender.  Labs were  obtained from triage without leukocytosis or electrolyte derangement.  Normal lipase.  UA appears contaminated with 21-50 WBCs present, rare bacteria.  Trace leuks, negative nitrates.  Pelvic ultrasound obtained, no acute findings.  Patient has been resting comfortably here.  We have discussed results.  She appears stable for discharge.  I have encouraged her to monitor symptoms closely over the weekend, follow-up with GYN if persisting on Monday.  She can return here for any new or acute changes.  Final Clinical Impression(s) / ED Diagnoses Final diagnoses:  Pelvic pain    Rx / DC Orders ED Discharge Orders          Ordered    HYDROcodone-acetaminophen (NORCO/VICODIN) 5-325 MG tablet  Every 4 hours PRN        12/09/22 0531              Garlon Hatchet, PA-C 12/09/22 0532    Nira Conn, MD 12/09/22 [phone]

## 2022-12-09 NOTE — Discharge Instructions (Addendum)
Can take hydrocodone if pain is severe, otherwise stick with over the counter medications. Follow-up with your OB/GYN if symptoms persist. Return here for any new or acute changes.

## 2022-12-13 ENCOUNTER — Encounter: Payer: PRIVATE HEALTH INSURANCE | Admitting: Physician Assistant

## 2022-12-13 DIAGNOSIS — M6289 Other specified disorders of muscle: Secondary | ICD-10-CM | POA: Diagnosis not present

## 2022-12-13 DIAGNOSIS — N750 Cyst of Bartholin's gland: Secondary | ICD-10-CM | POA: Diagnosis not present

## 2022-12-29 DIAGNOSIS — F319 Bipolar disorder, unspecified: Secondary | ICD-10-CM | POA: Diagnosis not present

## 2022-12-29 DIAGNOSIS — Z681 Body mass index (BMI) 19 or less, adult: Secondary | ICD-10-CM | POA: Diagnosis not present

## 2022-12-29 DIAGNOSIS — F419 Anxiety disorder, unspecified: Secondary | ICD-10-CM | POA: Diagnosis not present

## 2023-01-04 ENCOUNTER — Encounter: Payer: PRIVATE HEALTH INSURANCE | Admitting: Nurse Practitioner

## 2023-01-04 ENCOUNTER — Encounter: Payer: PRIVATE HEALTH INSURANCE | Admitting: Physician Assistant

## 2023-01-09 DIAGNOSIS — Z01818 Encounter for other preprocedural examination: Secondary | ICD-10-CM | POA: Diagnosis not present

## 2023-01-09 DIAGNOSIS — Z3202 Encounter for pregnancy test, result negative: Secondary | ICD-10-CM | POA: Diagnosis not present

## 2023-01-12 DIAGNOSIS — R102 Pelvic and perineal pain unspecified side: Secondary | ICD-10-CM | POA: Insufficient documentation

## 2023-01-12 DIAGNOSIS — Z302 Encounter for sterilization: Secondary | ICD-10-CM | POA: Diagnosis not present

## 2023-01-12 DIAGNOSIS — N736 Female pelvic peritoneal adhesions (postinfective): Secondary | ICD-10-CM | POA: Diagnosis not present

## 2023-01-12 DIAGNOSIS — G8929 Other chronic pain: Secondary | ICD-10-CM | POA: Diagnosis not present

## 2023-01-12 DIAGNOSIS — M6289 Other specified disorders of muscle: Secondary | ICD-10-CM | POA: Insufficient documentation

## 2023-01-12 DIAGNOSIS — Z3009 Encounter for other general counseling and advice on contraception: Secondary | ICD-10-CM | POA: Diagnosis not present

## 2023-01-14 ENCOUNTER — Telehealth: Payer: Commercial Managed Care - PPO | Admitting: Family

## 2023-01-14 DIAGNOSIS — M545 Low back pain, unspecified: Secondary | ICD-10-CM

## 2023-01-14 DIAGNOSIS — Z9889 Other specified postprocedural states: Secondary | ICD-10-CM

## 2023-01-14 NOTE — Progress Notes (Signed)
Because back pain and you just had surgery, I feel your condition warrants further evaluation and I recommend that you be seen in a face to face visit.   NOTE: There will be NO CHARGE for this eVisit   If you are having a true medical emergency please call 911.      For an urgent face to face visit, [place] has eight urgent care centers for your convenience:   NEW!! [facility] at [facility] Get Driving Directions [phone] [address] [place], [address]    [facility] at [facility] Get Driving Directions [phone] [address] [place], [place] [address]   [facility] Urgent Care Center [facility]) Get Driving Directions [phone] [address] [place], [place] [address]  [facility] Health Urgent Care Center [facility]) Get Driving Directions [phone] [address] [place],  [place]  [address]  [facility] Health Urgent Care Center [facility] at [employer]  [phone] [address] [place],  [place] [address]   [facility] Health Urgent Care at [facility] Get Driving Directions [phone] [address] [place], [place] [address]   [facility] Health Urgent Care at [facility] Get Driving Directions  [phone] [address] [place], [place] [address]   [facility] Health Urgent Care at [facility] Directions [phone] [address] [place], [place] [address]  Your MyChart E-visit questionnaire answers were reviewed by a board certified advanced clinical practitioner to complete your personal care plan based on your specific symptoms.  Thank you for using e-Visits.

## 2023-02-01 DIAGNOSIS — N3010 Interstitial cystitis (chronic) without hematuria: Secondary | ICD-10-CM | POA: Diagnosis not present

## 2023-02-01 DIAGNOSIS — G8929 Other chronic pain: Secondary | ICD-10-CM | POA: Diagnosis not present

## 2023-02-01 DIAGNOSIS — M6289 Other specified disorders of muscle: Secondary | ICD-10-CM | POA: Diagnosis not present

## 2023-02-01 DIAGNOSIS — R102 Pelvic and perineal pain: Secondary | ICD-10-CM | POA: Diagnosis not present

## 2023-02-01 DIAGNOSIS — Z48816 Encounter for surgical aftercare following surgery on the genitourinary system: Secondary | ICD-10-CM | POA: Diagnosis not present

## 2023-02-01 DIAGNOSIS — R87810 Cervical high risk human papillomavirus (HPV) DNA test positive: Secondary | ICD-10-CM | POA: Diagnosis not present

## 2023-02-01 DIAGNOSIS — N870 Mild cervical dysplasia: Secondary | ICD-10-CM | POA: Diagnosis not present

## 2023-02-01 DIAGNOSIS — Z09 Encounter for follow-up examination after completed treatment for conditions other than malignant neoplasm: Secondary | ICD-10-CM | POA: Insufficient documentation

## 2023-02-04 ENCOUNTER — Encounter: Payer: PRIVATE HEALTH INSURANCE | Admitting: Nurse Practitioner

## 2023-06-06 ENCOUNTER — Telehealth: Payer: PRIVATE HEALTH INSURANCE

## 2023-09-19 ENCOUNTER — Ambulatory Visit (INDEPENDENT_AMBULATORY_CARE_PROVIDER_SITE_OTHER): Payer: PRIVATE HEALTH INSURANCE | Admitting: Physician Assistant

## 2023-09-19 ENCOUNTER — Encounter: Payer: Self-pay | Admitting: Physician Assistant

## 2023-09-19 VITALS — BP 118/88 | HR 92 | Temp 97.3°F | Ht 60.0 in | Wt 95.0 lb

## 2023-09-19 DIAGNOSIS — N3010 Interstitial cystitis (chronic) without hematuria: Secondary | ICD-10-CM | POA: Diagnosis not present

## 2023-09-19 DIAGNOSIS — R102 Pelvic and perineal pain: Secondary | ICD-10-CM | POA: Diagnosis not present

## 2023-09-19 DIAGNOSIS — M2550 Pain in unspecified joint: Secondary | ICD-10-CM

## 2023-09-19 DIAGNOSIS — Z13220 Encounter for screening for lipoid disorders: Secondary | ICD-10-CM

## 2023-09-19 DIAGNOSIS — F900 Attention-deficit hyperactivity disorder, predominantly inattentive type: Secondary | ICD-10-CM

## 2023-09-19 DIAGNOSIS — N920 Excessive and frequent menstruation with regular cycle: Secondary | ICD-10-CM

## 2023-09-19 DIAGNOSIS — G8929 Other chronic pain: Secondary | ICD-10-CM

## 2023-09-19 MED ORDER — AMPHETAMINE-DEXTROAMPHET ER 10 MG PO CP24
10.0000 mg | ORAL_CAPSULE | Freq: Every day | ORAL | 0 refills | Status: DC
Start: 2023-09-19 — End: 2023-10-17

## 2023-09-19 MED ORDER — HYDROXYZINE HCL 25 MG PO TABS
25.0000 mg | ORAL_TABLET | Freq: Every evening | ORAL | 3 refills | Status: DC
Start: 2023-09-19 — End: 2023-12-29

## 2023-09-19 NOTE — Patient Instructions (Signed)
 VISIT SUMMARY:  During today's visit, we discussed your ongoing symptoms related to interstitial cystitis and ADHD. We reviewed your current medications and management strategies for both conditions. Additionally, we addressed your chronic back pain and discussed a new treatment plan for your ADHD symptoms.  YOUR PLAN:  -INTERSTITIAL CYSTITIS: Interstitial cystitis is a chronic condition causing bladder pain and spasms. You will continue taking hydroxyzine  25 mg at night and follow up with your urologist in August.  -PELVIC FLOOR DYSFUNCTION: Pelvic floor dysfunction involves tension in the pelvic floor muscles. You will continue taking diazepam as needed, up to three times a week, to manage this condition.  -CHRONIC BACK PAIN: Chronic back pain can be due to various causes, including sciatica. We will order blood work to check kidney and liver function, electrolytes, and thyroid function. If the lab results are normal, we may consider an abdominal ultrasound.  -ATTENTION-DEFICIT HYPERACTIVITY DISORDER (ADHD): ADHD is a condition that affects your ability to stay focused and on task. We have prescribed Adderall XR 10 mg once daily, to be taken 30 minutes before work. Please follow up in one month to assess its effectiveness and report any side effects or lack of efficacy.  -BIPOLAR DISORDER: Bipolar disorder is a mental health condition characterized by mood swings. You will continue your current medications: Lamictal, duloxetine, and hydroxyzine . Monitor for any mood changes or side effects with the introduction of Adderall.  INSTRUCTIONS:  Please follow up with your urologist in August for your interstitial cystitis. We will have a follow-up appointment in one month to assess the effectiveness of Adderall for your ADHD symptoms. Report any side effects or lack of efficacy before your next prescription. Additionally, we will conduct blood work to check your kidney and liver function, electrolytes,  and thyroid function. If these results are normal, we may consider an abdominal ultrasound.

## 2023-09-19 NOTE — Progress Notes (Signed)
 Subjective:  Patient ID: Melissa Ware, female    DOB: [birth year]  Age: 33 y.o. MRN: [id number]  Chief Complaint  Patient presents with   Re-esatblish    HPI:  Discussed the use of AI scribe software for clinical note transcription with the patient, who gave verbal consent to proceed.  History of Present Illness   Melissa Ware is a 33 year old female with interstitial cystitis and ADHD who presents for evaluation of ADHD symptoms and management of interstitial cystitis.  She experiences persistent bladder pain and spasms due to interstitial cystitis. Despite taking hydroxyzine  at night, her symptoms remain largely unchanged. She has previously tried pelvic floor physical therapy, which provided temporary relief, and finds diazepam most helpful for pelvic floor tension. An appointment with a urologist specializing in gynecology and neurology is scheduled for August.  She reports symptoms consistent with ADHD, including difficulty staying on task, being easily distracted, and zoning out, which have become more noticeable since she began working from home. She has not tried any medications for ADHD but is familiar with Adderall and Concerta, as her mother, sister, and daughter use them. She is concerned about her ability to stay motivated after work due to ADHD symptoms.  She has a history of bipolar disorder, managed by her psychiatrist through telehealth services. Current medications include Lamictal, duloxetine, and hydroxyzine . Her psychiatrist cannot prescribe ADHD medications due to telehealth restrictions across state lines.  She experiences chronic lower back pain, described as an ache rather than sharp or tingling, with occasional radiation down her leg, possibly due to sciatica. Her husband notices tenderness when massaging her back.  She has a history of abnormal Pap smears, but the last two have been normal. She had her tubes removed last year, which has resulted in less  heavy periods, with the first day being rough, the second day tolerable, and almost gone by the third day.  She quit smoking in 2018, having previously smoked about half a pack a week. She drinks alcohol socially and reports no issues with blood pressure, cholesterol, or diabetes. She tries to drink two large containers of water daily but finds it challenging due to the discomfort caused by interstitial cystitis.       Adult ADHD Self Report Scale (most recent)     Adult ADHD Self-Report Scale (ASRS-v1.1) Symptom Checklist - 09/19/23 1541       Part A   1. How often do you have trouble wrapping up the final details of a project, once the challenging parts have been done? Often  2. How often do you have difficulty getting things done in order when you have to do a task that requires organization? Sometimes    3. How often do you have problems remembering appointments or obligations? Very Often  4. When you have a task that requires a lot of thought, how often do you avoid or delay getting started? Very Often    5. How often do you fidget or squirm with your hands or feet when you have to sit down for a long time? Very Often  6. How often do you feel overly active and compelled to do things, like you were driven by a motor? Sometimes      Part B   7. How often do you make careless mistakes when you have to work on a boring or difficult project? Often  8. How often do you have difficulty keeping your attention when you are doing boring or repetitive work?  Very Often    9. How often do you have difficulty concentrating on what people say to you, even when they are speaking to you directly? Very Often  10. How often do you misplace or have difficulty finding things at home or at work? Often    11. How often are you distracted by activity or noise around you? Sometimes  12. How often do you leave your seat in meetings or other situations in which you are expected to remain seated? Sometimes    13. How  often do you feel restless or fidgety? Often  14. How often do you have difficulty unwinding and relaxing when you have time to yourself? Sometimes    15. How often do you find yourself talking too much when you are in social situations? Very Often  16. When you are in a conversation, how often do you find yourself finishing the sentences of the people you are talking to, before they can finish them themselves? Very Often    17. How often do you have difficulty waiting your turn in situations when turn taking is required? Very Often  18. How often do you interrupt others when they are busy? Sometimes      Comment   How old were you when these problems first began to occur? 15                     09/19/2023    3:36 PM 01/02/2022   11:35 AM  Depression screen PHQ 2/9  Decreased Interest 2 0  Down, Depressed, Hopeless 1 0  PHQ - 2 Score 3 0  Altered sleeping 3 0  Tired, decreased energy 3 0  Change in appetite 2 0  Feeling bad or failure about yourself  0 0  Trouble concentrating 3 0  Moving slowly or fidgety/restless 3 0  Suicidal thoughts 0 0  PHQ-9 Score 17 0  Difficult doing work/chores Extremely dIfficult Not difficult at all        09/19/2023    3:36 PM  Fall Risk   Falls in the past year? 0  Number falls in past yr: 0  Injury with Fall? 0  Risk for fall due to : No Fall Risks  Follow up Falls evaluation completed    Patient Care Team: Odilia Bennett, [place] as PCP - General (Physician Assistant)   Review of Systems  Constitutional:  Negative for appetite change, fatigue and fever.  HENT:  Negative for congestion, ear pain, sinus pressure and sore throat.   Respiratory:  Negative for cough, chest tightness, shortness of breath and wheezing.   Cardiovascular:  Negative for chest pain and palpitations.  Gastrointestinal:  Negative for abdominal pain, constipation, diarrhea, nausea and vomiting.  Genitourinary:  Negative for dysuria and hematuria.  Musculoskeletal:   Negative for arthralgias, back pain, joint swelling and myalgias.  Skin:  Negative for rash.  Neurological:  Negative for dizziness, weakness and headaches.  Psychiatric/Behavioral:  Negative for dysphoric mood. The patient is not nervous/anxious.     Current Outpatient Medications on File Prior to Visit  Medication Sig Dispense Refill   FLUoxetine (PROZAC) 20 MG capsule Take 20 mg by mouth daily.     lamoTRIgine (LAMICTAL) 100 MG tablet Take 100 mg by mouth daily.     No current facility-administered medications on file prior to visit.   Past Medical History:  Diagnosis Date   Abnormal Pap smear    Anxiety    Not on meds  Cystitis, interstitial    H/O varicella    Panic attacks    Not on meds   Vaginal Pap smear, abnormal    Past Surgical History:  Procedure Laterality Date   APPENDECTOMY  04/24/2005   BILATERAL SALPINGECTOMY Bilateral    CHOLECYSTECTOMY, LAPAROSCOPIC  04/24/2005   GYNECOLOGIC CRYOSURGERY      Family History  Problem Relation Age of Onset   Other Other        "allergic to anesthesia"   Heart disease Father        sudden cardiac death   Hypertension Mother    Hyperlipidemia Mother    Hypertension Maternal Grandmother    Non-Hodgkin's lymphoma Maternal Grandmother    Hypothyroidism Maternal Grandmother    Hyperlipidemia Maternal Grandmother    Diabetes Maternal Grandfather    Social History   Socioeconomic History   Marital status: Married    Spouse name: Not on file   Number of children: 2   Years of education: Not on file   Highest education level: Not on file  Occupational History   Not on file  Tobacco Use   Smoking status: Former    Current packs/day: 0.00    Average packs/day: 0.5 packs/day for 0.5 years (0.2 ttl pk-yrs)    Types: Cigarettes    Start date: 09/29/2010    Quit date: 03/30/2011    Years since quitting: 12.4   Smokeless tobacco: Never  Vaping Use   Vaping status: Never Used  Substance and Sexual Activity   Alcohol  use: Not Currently   Drug use: No   Sexual activity: Never  Other Topics Concern   Not on file  Social History Narrative   Not on file   Social Drivers of Health   Financial Resource Strain: Low Risk  (01/02/2022)   Overall Financial Resource Strain (CARDIA)    Difficulty of Paying Living Expenses: Not hard at all  Food Insecurity: No Food Insecurity (01/02/2022)   Hunger Vital Sign    Worried About Running Out of Food in the Last Year: Never true    Ran Out of Food in the Last Year: Never true  Transportation Needs: No Transportation Needs (01/02/2022)   PRAPARE - Administrator, Civil Service (Medical): No    Lack of Transportation (Non-Medical): No  Physical Activity: Inactive (01/02/2022)   Exercise Vital Sign    Days of Exercise per Week: 0 days    Minutes of Exercise per Session: 0 min  Stress: No Stress Concern Present (01/02/2022)   [employer] of Occupational Health - Occupational Stress Questionnaire    Feeling of Stress : Not at all  Social Connections: Moderately Isolated (01/02/2022)   Social Connection and Isolation Panel [NHANES]    Frequency of Communication with Friends and Family: More than three times a week    Frequency of Social Gatherings with Friends and Family: More than three times a week    Attends Religious Services: Never    Database administrator or Organizations: No    Attends Engineer, structural: Never    Marital Status: Married    Objective:  BP 118/88 (BP Location: Left Arm, Patient Position: Sitting)   Pulse 92   Temp (!) 97.3 F (36.3 C) (Temporal)   Ht 5' (1.524 m)   Wt 95 lb (43.1 kg)   SpO2 96%   BMI 18.55 kg/m      09/19/2023    3:38 PM 12/09/2022    5:37 AM 12/09/2022  4:10 AM  BP/Weight  Systolic BP 118 98 97  Diastolic BP 88 60 53  Wt. (Lbs) 95    BMI 18.55 kg/m2      Physical Exam Constitutional:      Appearance: Normal appearance.  HENT:     Right Ear: Tympanic membrane normal.     Left  Ear: Tympanic membrane normal.     Nose: Nose normal.     Mouth/Throat:     Pharynx: No oropharyngeal exudate or posterior oropharyngeal erythema.  Eyes:     Conjunctiva/sclera: Conjunctivae normal.  Cardiovascular:     Rate and Rhythm: Normal rate and regular rhythm.     Heart sounds: Normal heart sounds.  Pulmonary:     Effort: Pulmonary effort is normal.     Breath sounds: Normal breath sounds.  Abdominal:     General: Bowel sounds are normal.     Palpations: Abdomen is soft.     Tenderness: There is no abdominal tenderness.  Skin:    Findings: No lesion or rash.  Neurological:     Mental Status: She is alert and oriented to person, place, and time.  Psychiatric:        Behavior: Behavior normal.     Diabetic Foot Exam - Simple   No data filed      Lab Results  Component Value Date   WBC 9.5 09/19/2023   HGB 13.6 09/19/2023   HCT 40.3 09/19/2023   PLT 424 09/19/2023   GLUCOSE 80 09/19/2023   CHOL 195 09/19/2023   TRIG 73 09/19/2023   HDL 58 09/19/2023   LDLCALC 124 (H) 09/19/2023   ALT 15 09/19/2023   AST 21 09/19/2023   NA 138 09/19/2023   K 4.4 09/19/2023   CL 103 09/19/2023   CREATININE 0.76 09/19/2023   BUN 7 09/19/2023   CO2 20 09/19/2023   TSH 2.640 09/19/2023   HGBA1C 5.1 09/19/2023      Assessment & Plan:  IC (interstitial cystitis) Assessment & Plan: Chronic bladder pain and spasms consistent with interstitial cystitis. Persistent symptoms despite hydroxyzine  treatment. Scheduled urology consultation in August. - Continue hydroxyzine  25 mg once at night. - Follow up with urologist in August.  Orders: -     hydrOXYzine  HCl; Take 1 tablet (25 mg total) by mouth at bedtime.  Dispense: 30 tablet; Refill: 3 -     CBC with Differential/Platelet  Attention deficit hyperactivity disorder (ADHD), predominantly inattentive type Assessment & Plan: Symptoms consistent with ADHD, including difficulty staying on task. Family history present. Prefers  extended-release formulation. Discussed Adderall, Concerta, and Vyvanse, including side effects such as appetite suppression, fluid suppression, and potential agitation or manic episodes. Adderall is cost-effective and commonly used. - Prescribe Adderall XR 10 mg once daily, 30 minutes before work. - Follow up in one month to assess efficacy and side effects. - Report any side effects or lack of efficacy before next prescription.  Orders: -     Comprehensive metabolic panel with GFR -     T4, free -     TSH -     Amphetamine -Dextroamphet ER; Take 1 capsule (10 mg total) by mouth daily.  Dispense: 30 capsule; Refill: 0  ADHD (attention deficit hyperactivity disorder), inattentive type Assessment & Plan: Symptoms consistent with ADHD, including difficulty staying on task. Family history present. Prefers extended-release formulation. Discussed Adderall, Concerta, and Vyvanse, including side effects such as appetite suppression, fluid suppression, and potential agitation or manic episodes. Adderall is cost-effective and commonly used. -  Prescribe Adderall XR 10 mg once daily, 30 minutes before work. - Follow up in one month to assess efficacy and side effects. - Report any side effects or lack of efficacy before next prescription.   Chronic pelvic pain in female Assessment & Plan: Pelvic floor dysfunction with persistent tension. Previous physical therapy provided temporary relief. Diazepam effective for management, limited to three times a week. - Continue diazepam as needed, up to three times a week.   Menorrhagia with regular cycle Assessment & Plan: Pelvic floor dysfunction with persistent tension. Previous physical therapy provided temporary relief. Diazepam effective for management, limited to three times a week. - Continue diazepam as needed, up to three times a week.  Orders: -     CBC with Differential/Platelet -     VITAMIN D  25 Hydroxy (Vit-D Deficiency, Fractures) -     Iron,  TIBC and Ferritin Panel  Arthralgia, unspecified joint Assessment & Plan: Chronic lower back pain with occasional leg radiation, possibly sciatica. No new or worsening symptoms. - Order blood work: kidney and liver function, electrolytes, thyroid function. - Consider abdominal ultrasound if lab results are normal.  Orders: -     Hemoglobin A1c  Screening cholesterol level -     Lipid panel      Meds ordered this encounter  Medications   hydrOXYzine  (ATARAX ) 25 MG tablet    Sig: Take 1 tablet (25 mg total) by mouth at bedtime.    Dispense:  30 tablet    Refill:  3    Supervising Provider:   [facility]X, KIRSTEN [[id number]]   amphetamine -dextroamphetamine (ADDERALL XR) 10 MG 24 hr capsule    Sig: Take 1 capsule (10 mg total) by mouth daily.    Dispense:  30 capsule    Refill:  0    Supervising Provider:   Mercy Stall [phone]    Orders Placed This Encounter  Procedures   Lipid panel   Hemoglobin A1c   Comprehensive metabolic panel with GFR   CBC with Differential/Platelet   T4, free   TSH   VITAMIN D  25 Hydroxy (Vit-D Deficiency, Fractures)   Iron, TIBC and Ferritin Panel     Follow-up: Return in about 4 weeks (around 10/17/2023) for Chronic, Mirian Ames.   I,Lauren M Auman,acting as a Neurosurgeon for [employer], [employer].,have documented all relevant documentation on the behalf of Odilia Bennett, PA,as directed by  Odilia Bennett, PA while in the presence of Odilia Bennett, [place].   An After Visit Summary was printed and given to the patient.  Odilia Bennett, [place] [facility] [phone]

## 2023-09-20 ENCOUNTER — Encounter: Payer: Self-pay | Admitting: Physician Assistant

## 2023-09-20 ENCOUNTER — Ambulatory Visit: Payer: Self-pay | Admitting: Physician Assistant

## 2023-09-20 DIAGNOSIS — F900 Attention-deficit hyperactivity disorder, predominantly inattentive type: Secondary | ICD-10-CM | POA: Insufficient documentation

## 2023-09-20 LAB — LIPID PANEL
Chol/HDL Ratio: 3.4 ratio (ref 0.0–4.4)
Cholesterol, Total: 195 mg/dL (ref 100–199)
HDL: 58 mg/dL (ref >39)
LDL Chol Calc (NIH): 124 mg/dL — ABNORMAL HIGH (ref 0–99)
Triglycerides: 73 mg/dL (ref 0–149)
VLDL Cholesterol Cal: 13 mg/dL (ref 5–40)

## 2023-09-20 LAB — CBC WITH DIFFERENTIAL/PLATELET
Basophils Absolute: 0.1 10*3/uL (ref 0.0–0.2)
Basos: 1 %
EOS (ABSOLUTE): 0.1 10*3/uL (ref 0.0–0.4)
Eos: 1 %
Hematocrit: 40.3 % (ref 34.0–46.6)
Hemoglobin: 13.6 g/dL (ref 11.1–15.9)
Immature Grans (Abs): 0 10*3/uL (ref 0.0–0.1)
Immature Granulocytes: 0 %
Lymphocytes Absolute: 2 10*3/uL (ref 0.7–3.1)
Lymphs: 21 %
MCH: 32.7 pg (ref 26.6–33.0)
MCHC: 33.7 g/dL (ref 31.5–35.7)
MCV: 97 fL (ref 79–97)
Monocytes Absolute: 0.6 10*3/uL (ref 0.1–0.9)
Monocytes: 7 %
Neutrophils Absolute: 6.7 10*3/uL (ref 1.4–7.0)
Neutrophils: 70 %
Platelets: 424 10*3/uL (ref 150–450)
RBC: 4.16 x10E6/uL (ref 3.77–5.28)
RDW: 11.9 % (ref 11.7–15.4)
WBC: 9.5 10*3/uL (ref 3.4–10.8)

## 2023-09-20 LAB — TSH: TSH: 2.64 u[IU]/mL (ref 0.450–4.500)

## 2023-09-20 LAB — IRON,TIBC AND FERRITIN PANEL
Ferritin: 56 ng/mL (ref 15–150)
Iron Saturation: 23 % (ref 15–55)
Iron: 82 ug/dL (ref 27–159)
Total Iron Binding Capacity: 354 ug/dL (ref 250–450)
UIBC: 272 ug/dL (ref 131–425)

## 2023-09-20 LAB — COMPREHENSIVE METABOLIC PANEL WITH GFR
ALT: 15 IU/L (ref 0–32)
AST: 21 IU/L (ref 0–40)
Albumin: 4.5 g/dL (ref 3.9–4.9)
Alkaline Phosphatase: 55 IU/L (ref 44–121)
BUN/Creatinine Ratio: 9 (ref 9–23)
BUN: 7 mg/dL (ref 6–20)
Bilirubin Total: 0.3 mg/dL (ref 0.0–1.2)
CO2: 20 mmol/L (ref 20–29)
Calcium: 9.9 mg/dL (ref 8.7–10.2)
Chloride: 103 mmol/L (ref 96–106)
Creatinine, Ser: 0.76 mg/dL (ref 0.57–1.00)
Globulin, Total: 2.8 g/dL (ref 1.5–4.5)
Glucose: 80 mg/dL (ref 70–99)
Potassium: 4.4 mmol/L (ref 3.5–5.2)
Sodium: 138 mmol/L (ref 134–144)
Total Protein: 7.3 g/dL (ref 6.0–8.5)
eGFR: 106 mL/min/{1.73_m2} (ref >59)

## 2023-09-20 LAB — T4, FREE: Free T4: 1 ng/dL (ref 0.82–1.77)

## 2023-09-20 LAB — VITAMIN D 25 HYDROXY (VIT D DEFICIENCY, FRACTURES): Vit D, 25-Hydroxy: 38.2 ng/mL (ref 30.0–100.0)

## 2023-09-20 LAB — HEMOGLOBIN A1C
Est. average glucose Bld gHb Est-mCnc: 100 mg/dL
Hgb A1c MFr Bld: 5.1 % (ref 4.8–5.6)

## 2023-09-20 NOTE — Telephone Encounter (Signed)
 Melissa Ware,  I have done the prior authorization for the medication and it has been approved. So you can call your pharmacy and let them know to run the medication back through since it has been approved!  -Barbra Ley

## 2023-09-23 DIAGNOSIS — Z13220 Encounter for screening for lipoid disorders: Secondary | ICD-10-CM | POA: Insufficient documentation

## 2023-09-23 DIAGNOSIS — N920 Excessive and frequent menstruation with regular cycle: Secondary | ICD-10-CM | POA: Insufficient documentation

## 2023-09-23 NOTE — Assessment & Plan Note (Signed)
 Symptoms consistent with ADHD, including difficulty staying on task. Family history present. Prefers extended-release formulation. Discussed Adderall, Concerta, and Vyvanse, including side effects such as appetite suppression, fluid suppression, and potential agitation or manic episodes. Adderall is cost-effective and commonly used. - Prescribe Adderall XR 10 mg once daily, 30 minutes before work. - Follow up in one month to assess efficacy and side effects. - Report any side effects or lack of efficacy before next prescription.

## 2023-09-23 NOTE — Assessment & Plan Note (Signed)
 Pelvic floor dysfunction with persistent tension. Previous physical therapy provided temporary relief. Diazepam effective for management, limited to three times a week. - Continue diazepam as needed, up to three times a week.

## 2023-09-23 NOTE — Assessment & Plan Note (Signed)
 Chronic bladder pain and spasms consistent with interstitial cystitis. Persistent symptoms despite hydroxyzine  treatment. Scheduled urology consultation in August. - Continue hydroxyzine  25 mg once at night. - Follow up with urologist in August.

## 2023-09-23 NOTE — Assessment & Plan Note (Signed)
 Chronic lower back pain with occasional leg radiation, possibly sciatica. No new or worsening symptoms. - Order blood work: kidney and liver function, electrolytes, thyroid function. - Consider abdominal ultrasound if lab results are normal.

## 2023-10-17 ENCOUNTER — Ambulatory Visit (INDEPENDENT_AMBULATORY_CARE_PROVIDER_SITE_OTHER): Payer: PRIVATE HEALTH INSURANCE | Admitting: Physician Assistant

## 2023-10-17 ENCOUNTER — Encounter: Payer: Self-pay | Admitting: Physician Assistant

## 2023-10-17 VITALS — BP 120/80 | HR 88 | Temp 97.6°F | Resp 14 | Ht 60.0 in | Wt 97.0 lb

## 2023-10-17 DIAGNOSIS — M549 Dorsalgia, unspecified: Secondary | ICD-10-CM | POA: Diagnosis not present

## 2023-10-17 DIAGNOSIS — F900 Attention-deficit hyperactivity disorder, predominantly inattentive type: Secondary | ICD-10-CM | POA: Diagnosis not present

## 2023-10-17 MED ORDER — AMPHETAMINE-DEXTROAMPHET ER 30 MG PO CP24
30.0000 mg | ORAL_CAPSULE | ORAL | 0 refills | Status: DC
Start: 2023-10-17 — End: 2023-11-15

## 2023-10-17 NOTE — Progress Notes (Signed)
 Subjective:  Patient ID: Melissa Ware, female    DOB: 30-Mar-1991  Age: 33 y.o. MRN: [id number]  Chief Complaint  Patient presents with   ADHD    HPI: Patient was seen on 09/19/23 for ADHD. She started adderall 10 mg every day. She states that it is not working.  Discussed the use of AI scribe software for clinical note transcription with the patient, who gave verbal consent to proceed.  History of Present Illness   Melissa Ware is a 33 year old female who presents with back pain and focus issues.  She has been experiencing back pain for the past three weeks, which she manages with pelvic floor physical therapy exercises at home. She notes some improvement, particularly in her ability to hold urine longer. The exercises are aimed at relaxing tight muscles and strengthening supporting muscles.  She has a history of ovarian cysts and experiences minor pain on one side, which she associates with pressure. She is scheduled for a follow-up appointment at [facility] on November 17, 2023.  She reports issues with focus and attention, stating that she can work solidly for about thirty minutes before losing focus. She has been on a 10 mg dose of medication, which she feels has not been effective in improving her focus. No heart racing or waking up at night due to the medication.  In terms of circulation, her toes sometimes turn white, but not blue, and she does not describe it as painful, just requiring movement to restore color. She has no known history of autoimmune conditions.  She has been using hydroxyzine , which makes her sleepy, but she does not take it every night. It is used more for managing pain rather than as a sleep aid.          10/17/2023    3:33 PM 09/19/2023    3:36 PM 01/02/2022   11:35 AM  Depression screen PHQ 2/9  Decreased Interest 0 2 0  Down, Depressed, Hopeless 0 1 0  PHQ - 2 Score 0 3 0  Altered sleeping 2 3 0  Tired, decreased energy 2 3 0  Change in  appetite 0 2 0  Feeling bad or failure about yourself  0 0 0  Trouble concentrating 3 3 0  Moving slowly or fidgety/restless 0 3 0  Suicidal thoughts 0 0 0  PHQ-9 Score 7 17 0  Difficult doing work/chores Very difficult Extremely dIfficult Not difficult at all        10/17/2023    3:32 PM  Fall Risk   Falls in the past year? 0  Number falls in past yr: 0  Injury with Fall? 0  Risk for fall due to : No Fall Risks  Follow up Education provided    Patient Care Team: Milon Cleaves, [place] as PCP - General (Physician Assistant)   Review of Systems  Constitutional:  Negative for chills, fatigue and fever.  HENT:  Negative for congestion, ear pain and sore throat.   Respiratory:  Negative for cough and shortness of breath.   Cardiovascular:  Negative for chest pain and palpitations.  Gastrointestinal:  Negative for abdominal pain, constipation, diarrhea, nausea and vomiting.  Endocrine: Negative for polydipsia, polyphagia and polyuria.  Genitourinary:  Negative for difficulty urinating and dysuria.  Musculoskeletal:  Negative for arthralgias, back pain and myalgias.  Skin:  Negative for rash.  Neurological:  Negative for headaches.  Psychiatric/Behavioral:  Negative for dysphoric mood. The patient is not nervous/anxious.  Current Outpatient Medications on File Prior to Visit  Medication Sig Dispense Refill   ANUCORT-HC 25 MG suppository Place 25 mg rectally 2 (two) times daily.     Boric Acid Vaginal (AZO BORIC ACID VA) Insert 1 capsule in vagina every night x 14 days then every Monday and Thursday night     diazepam (VALIUM) 5 MG tablet Insert 1 tablet in vagina at night 3 times a week     docusate sodium  (COLACE) 100 MG capsule Take 100 mg by mouth 2 (two) times daily.     FLUoxetine (PROZAC) 20 MG capsule Take 20 mg by mouth daily.     hydrOXYzine  (ATARAX ) 25 MG tablet Take 1 tablet (25 mg total) by mouth at bedtime. 30 tablet 3   lamoTRIgine (LAMICTAL) 100 MG tablet Take 100 mg  by mouth daily.     No current facility-administered medications on file prior to visit.   Past Medical History:  Diagnosis Date   Abnormal Pap smear    Anxiety    Not on meds   Cystitis, interstitial    H/O varicella    Panic attacks    Not on meds   Vaginal Pap smear, abnormal    Past Surgical History:  Procedure Laterality Date   APPENDECTOMY  04/24/2005   BILATERAL SALPINGECTOMY Bilateral    CHOLECYSTECTOMY, LAPAROSCOPIC  04/24/2005   GYNECOLOGIC CRYOSURGERY      Family History  Problem Relation Age of Onset   Other Other        allergic to anesthesia   Heart disease Father        sudden cardiac death   Hypertension Mother    Hyperlipidemia Mother    Hypertension Maternal Grandmother    Non-Hodgkin's lymphoma Maternal Grandmother    Hypothyroidism Maternal Grandmother    Hyperlipidemia Maternal Grandmother    Diabetes Maternal Grandfather    Social History   Socioeconomic History   Marital status: Married    Spouse name: Not on file   Number of children: 2   Years of education: Not on file   Highest education level: Not on file  Occupational History   Not on file  Tobacco Use   Smoking status: Former    Current packs/day: 0.00    Average packs/day: 0.5 packs/day for 0.5 years (0.2 ttl pk-yrs)    Types: Cigarettes    Start date: 09/29/2010    Quit date: 03/30/2011    Years since quitting: 12.5   Smokeless tobacco: Never  Vaping Use   Vaping status: Never Used  Substance and Sexual Activity   Alcohol use: Not Currently   Drug use: No   Sexual activity: Never  Other Topics Concern   Not on file  Social History Narrative   Not on file   Social Drivers of Health   Financial Resource Strain: Low Risk  (01/02/2022)   Overall Financial Resource Strain (CARDIA)    Difficulty of Paying Living Expenses: Not hard at all  Food Insecurity: No Food Insecurity (01/02/2022)   Hunger Vital Sign    Worried About Running Out of Food in the Last Year: Never true     Ran Out of Food in the Last Year: Never true  Transportation Needs: No Transportation Needs (01/02/2022)   PRAPARE - Administrator, Civil Service (Medical): No    Lack of Transportation (Non-Medical): No  Physical Activity: Inactive (01/02/2022)   Exercise Vital Sign    Days of Exercise per Week: 0 days  Minutes of Exercise per Session: 0 min  Stress: No Stress Concern Present (01/02/2022)   [employer] of Occupational Health - Occupational Stress Questionnaire    Feeling of Stress : Not at all  Social Connections: Moderately Isolated (01/02/2022)   Social Connection and Isolation Panel    Frequency of Communication with Friends and Family: More than three times a week    Frequency of Social Gatherings with Friends and Family: More than three times a week    Attends Religious Services: Never    Database administrator or Organizations: No    Attends Engineer, structural: Never    Marital Status: Married    Objective:  BP 120/80   Pulse 88   Temp 97.6 F (36.4 C)   Resp 14   Ht 5' (1.524 m)   Wt 97 lb (44 kg)   SpO2 99%   BMI 18.94 kg/m      10/17/2023    3:22 PM 09/19/2023    3:38 PM 12/09/2022    5:37 AM  BP/Weight  Systolic BP 120 118 98  Diastolic BP 80 88 60  Wt. (Lbs) 97 95   BMI 18.94 kg/m2 18.55 kg/m2     Physical Exam Vitals reviewed.  Constitutional:      Appearance: Normal appearance.   Cardiovascular:     Rate and Rhythm: Normal rate and regular rhythm.     Heart sounds: Normal heart sounds.  Pulmonary:     Effort: Pulmonary effort is normal.     Breath sounds: Normal breath sounds.  Abdominal:     General: Bowel sounds are normal.     Palpations: Abdomen is soft.     Tenderness: There is abdominal tenderness in the left lower quadrant.   Skin:    Coloration: Skin is mottled.     Comments: Bilateral legs Denies pain or numbness   Neurological:     Mental Status: She is alert and oriented to person, place, and  time.   Psychiatric:        Mood and Affect: Mood normal.        Behavior: Behavior normal.         Lab Results  Component Value Date   WBC 9.5 09/19/2023   HGB 13.6 09/19/2023   HCT 40.3 09/19/2023   PLT 424 09/19/2023   GLUCOSE 80 09/19/2023   CHOL 195 09/19/2023   TRIG 73 09/19/2023   HDL 58 09/19/2023   LDLCALC 124 (H) 09/19/2023   ALT 15 09/19/2023   AST 21 09/19/2023   NA 138 09/19/2023   K 4.4 09/19/2023   CL 103 09/19/2023   CREATININE 0.76 09/19/2023   BUN 7 09/19/2023   CO2 20 09/19/2023   TSH 2.640 09/19/2023   HGBA1C 5.1 09/19/2023      Assessment & Plan:  Attention deficit hyperactivity disorder (ADHD), predominantly inattentive type Assessment & Plan: Current 10 mg regimen ineffective. No side effects reported. Discussed increasing dosage and potential side effects. - Increase ADHD medication to 30 mg extended-release. - Monitor for side effects such as insomnia, tachycardia, and hypertension. - Consider short-acting medication for afternoon if symptoms persist. - Report any side effects or lack of efficacy via MyChart. - Follow-up in one month.  Orders: -     Amphetamine -Dextroamphet ER; Take 1 capsule (30 mg total) by mouth every morning.  Dispense: 30 capsule; Refill: 0  Acute right-sided back pain, unspecified back location Assessment & Plan: Some improvement with pelvic floor physical therapy.  Encouraged continuation and suggested formal therapy if progress plateaus. - Continue pelvic floor physical therapy at home. - Consider formal pelvic floor physical therapy if progress plateaus. - Attend appointment with pelvic floor specialist at [facility] on July 26.     Ovarian Cysts Mild unilateral pain possibly related to cysts. Recommended discussion with gynecologist and potential ultrasound. - Discuss pain and potential ovarian cysts with gynecologist. - Consider ultrasound to monitor cysts.  Raynaud's Phenomenon Episodes of toes  turning white in cold conditions, possibly indicating Raynaud's phenomenon. - Monitor symptoms of Raynaud's phenomenon.  General Health Maintenance LDL cholesterol slightly elevated. No immediate need for physical unless required by insurance. - Monitor LDL cholesterol levels. - Consider repeat labs later in the year.      Meds ordered this encounter  Medications   amphetamine -dextroamphetamine (ADDERALL XR) 30 MG 24 hr capsule    Sig: Take 1 capsule (30 mg total) by mouth every morning.    Dispense:  30 capsule    Refill:  0    No orders of the defined types were placed in this encounter.    Follow-up: Return in about 4 weeks (around 11/14/2023) for Chronic, Nola.   I,Marla I Leal-Borjas,acting as a scribe for [employer], PA.,have documented all relevant documentation on the behalf of Nola Angles, PA,as directed by  Nola Angles, PA while in the presence of Nola Angles, [place].   An After Visit Summary was printed and given to the patient.  Nola Angles, [place] [facility] [phone]

## 2023-10-17 NOTE — Patient Instructions (Signed)
 VISIT SUMMARY:  Today, you were seen for back pain and focus issues. We discussed your current symptoms, reviewed your treatment plan, and made some adjustments to your medications. We also talked about your history of ovarian cysts and circulation issues in your toes.  YOUR PLAN:  -ATTENTION DEFICIT HYPERACTIVITY DISORDER (ADHD): ADHD is a condition that affects your ability to focus and stay attentive. Since your current 10 mg medication is not effective, we are increasing your dose to 30 mg extended-release. Please monitor for any side effects like trouble sleeping, fast heartbeat, or high blood pressure. If your symptoms continue in the afternoon, we might consider adding a short-acting medication. Report any side effects or if the medication is not working through [employer]. We will follow up in one month to see how you are doing.  -CHRONIC BACK PAIN: Chronic back pain is long-lasting pain in your back. You have seen some improvement with pelvic floor physical therapy exercises at home. Continue these exercises, and if you stop making progress, consider formal therapy. Keep your appointment with the pelvic floor specialist at [facility] on July 26.  -OVARIAN CYSTS: Ovarian cysts are fluid-filled sacs on your ovaries that can cause pain. You have mild pain that might be related to these cysts. Discuss this pain with your gynecologist and consider getting an ultrasound to monitor the cysts.  -RAYNAUD'S PHENOMENON: Raynaud's phenomenon is a condition where your toes turn white in cold conditions due to reduced blood flow. Keep an eye on your symptoms and let us  know if they worsen.  -GENERAL HEALTH MAINTENANCE: Your LDL cholesterol is slightly elevated. We will monitor your cholesterol levels and consider repeating the labs later this year. There is no immediate need for a physical unless required by your insurance.  INSTRUCTIONS:  Please follow up in one month for ADHD medication review.  Keep your appointment with the pelvic floor specialist at [facility] on July 26. Discuss your ovarian cyst pain with your gynecologist and consider an ultrasound. Monitor your symptoms of Raynaud's phenomenon and your cholesterol levels.

## 2023-10-22 NOTE — Assessment & Plan Note (Signed)
 Current 10 mg regimen ineffective. No side effects reported. Discussed increasing dosage and potential side effects. - Increase ADHD medication to 30 mg extended-release. - Monitor for side effects such as insomnia, tachycardia, and hypertension. - Consider short-acting medication for afternoon if symptoms persist. - Report any side effects or lack of efficacy via MyChart. - Follow-up in one month.

## 2023-10-22 NOTE — Assessment & Plan Note (Signed)
 Some improvement with pelvic floor physical therapy. Encouraged continuation and suggested formal therapy if progress plateaus. - Continue pelvic floor physical therapy at home. - Consider formal pelvic floor physical therapy if progress plateaus. - Attend appointment with pelvic floor specialist at [facility] on July 26.

## 2023-11-01 ENCOUNTER — Other Ambulatory Visit: Payer: Self-pay | Admitting: Physician Assistant

## 2023-11-01 ENCOUNTER — Encounter: Payer: Self-pay | Admitting: Physician Assistant

## 2023-11-01 DIAGNOSIS — F900 Attention-deficit hyperactivity disorder, predominantly inattentive type: Secondary | ICD-10-CM

## 2023-11-01 MED ORDER — AMPHETAMINE-DEXTROAMPHETAMINE 20 MG PO TABS: 20.0000 mg | ORAL_TABLET | Freq: Every day | ORAL | 0 refills | Status: DC

## 2023-11-06 ENCOUNTER — Telehealth: Payer: Self-pay

## 2023-11-06 NOTE — Telephone Encounter (Signed)
 PA submitted and approved via covermymeds for adderall 20mg . CaseId:[id number];Status:Approved;Review Type:Prior Auth;Coverage Start Date:11/06/2023;Coverage End Date:11/05/2024;

## 2023-11-09 ENCOUNTER — Other Ambulatory Visit: Payer: Self-pay | Admitting: Medical Genetics

## 2023-11-13 ENCOUNTER — Telehealth: Payer: Self-pay | Admitting: Physician Assistant

## 2023-11-13 NOTE — Telephone Encounter (Unsigned)
 Copied from CRM #[id number]. Topic: General - Billing Inquiry >> Nov 13, 2023  1:51 PM Tiffini S wrote: Reason for CRM: Patient called for the self pay cost for a physical exam/ last exam was 12/2022. Asked for a call back at [phone].

## 2023-11-14 NOTE — Telephone Encounter (Signed)
 Patient notified

## 2023-11-15 ENCOUNTER — Ambulatory Visit: Admitting: Physician Assistant

## 2023-11-15 ENCOUNTER — Telehealth: Admitting: Physician Assistant

## 2023-11-15 DIAGNOSIS — F900 Attention-deficit hyperactivity disorder, predominantly inattentive type: Secondary | ICD-10-CM

## 2023-11-15 MED ORDER — LISDEXAMFETAMINE DIMESYLATE 30 MG PO CAPS
30.0000 mg | ORAL_CAPSULE | Freq: Every day | ORAL | 0 refills | Status: DC
Start: 2023-11-15 — End: 2023-12-04

## 2023-11-15 NOTE — Patient Instructions (Signed)
 VISIT SUMMARY:  Today, we discussed your current ADHD medication and its effectiveness. You mentioned that while your medication helps with household tasks, you still struggle with work-related tasks and feel fatigued by 6 PM. We have decided to make some adjustments to your medication regimen to help improve your focus and energy levels throughout the day.  YOUR PLAN:  -ATTENTION-DEFICIT/HYPERACTIVITY DISORDER (ADHD): ADHD is a condition that affects your ability to focus and control impulses. We have decided to discontinue your extended-release Adderall and start you on Vyvanse  30 mg daily in the morning. You can continue taking your short-acting Adderall 20 mg in the afternoon if needed. Please monitor for any side effects or if the medication is not working well, and report back in two weeks. We can follow up via a video visit or through [employer].  INSTRUCTIONS:  Please monitor for any side effects or if the medication is not working well, and report back in two weeks. We can follow up via a video visit or through [employer].

## 2023-11-15 NOTE — Assessment & Plan Note (Signed)
 Attention-Deficit/Hyperactivity Disorder (ADHD) Adderall ineffective for work tasks, effective for household tasks. Fatigue by 6 PM without side effects. Vyvanse  suggested for improved efficacy. - Discontinue extended-release Adderall. - Initiate Vyvanse  30 mg daily in the morning. - Continue short-acting Adderall 20 mg in the afternoon if needed. - Provide 30-day supply of Vyvanse . - Instruct her to monitor for side effects or lack of efficacy and report in two weeks. - Offer follow-up via video visit or MyChart message.

## 2023-11-15 NOTE — Progress Notes (Signed)
 Virtual Visit via Video Note   This visit type was conducted per patient request This format is felt to be appropriate for this patient at this time.  All issues noted in this document were discussed and addressed.  A limited physical exam was performed with this format.  A verbal consent was obtained for the virtual visit.   Date:  11/15/2023   ID:  Melissa Ware, DOB [birth year], MRN [id number]  Patient Location: Home Provider Location: Office/Clinic  PCP:  Milon Cleaves, PA   Chief Complaint  Patient presents with   Medical Management of Chronic Issues    ADD      History of Present Illness:    The patient does not have symptoms concerning for COVID-19 infection (fever, chills, cough, or new shortness of breath).   Discussed the use of AI scribe software for clinical note transcription with the patient, who gave verbal consent to proceed.  History of Present Illness   Melissa Ware is a 33 year old female with ADHD who presents for follow-up on her medication.  Since the adjustment of her ADHD medication, she has been able to complete household tasks such as laundry, cleaning, and cooking. However, she continues to struggle with work-related tasks. She feels ready for bed by 6 PM.  She denies experiencing any side effects such as racing heart or headaches from her current medication regimen. Currently, she is taking 20 mg of short-acting Adderall and 30 mg of extended-release Adderall. She has been using [facility] on [address] for her prescriptions.  Work remains busy, and she finds it challenging to keep up with projects consistently, with some days being more manageable than others.        Past Medical History:  Diagnosis Date   Abnormal Pap smear    Anxiety    Not on meds   Cystitis, interstitial    H/O varicella    Panic attacks    Not on meds   Vaginal Pap smear, abnormal     Past Surgical History:  Procedure Laterality Date   APPENDECTOMY   04/24/2005   BILATERAL SALPINGECTOMY Bilateral    CHOLECYSTECTOMY, LAPAROSCOPIC  04/24/2005   GYNECOLOGIC CRYOSURGERY      Family History  Problem Relation Age of Onset   Other Other        allergic to anesthesia   Heart disease Father        sudden cardiac death   Hypertension Mother    Hyperlipidemia Mother    Hypertension Maternal Grandmother    Non-Hodgkin's lymphoma Maternal Grandmother    Hypothyroidism Maternal Grandmother    Hyperlipidemia Maternal Grandmother    Diabetes Maternal Grandfather     Social History   Socioeconomic History   Marital status: Married    Spouse name: Not on file   Number of children: 2   Years of education: Not on file   Highest education level: Associate degree: occupational, Scientist, product/process development, or vocational program  Occupational History   Not on file  Tobacco Use   Smoking status: Former    Current packs/day: 0.00    Average packs/day: 0.5 packs/day for 0.5 years (0.2 ttl pk-yrs)    Types: Cigarettes    Start date: 09/29/2010    Quit date: 03/30/2011    Years since quitting: 12.6   Smokeless tobacco: Never  Vaping Use   Vaping status: Never Used  Substance and Sexual Activity   Alcohol use: Not Currently   Drug use: No   Sexual activity: Never  Other Topics Concern   Not on file  Social History Narrative   Not on file   Social Drivers of Health   Financial Resource Strain: Low Risk  (11/15/2023)   Overall Financial Resource Strain (CARDIA)    Difficulty of Paying Living Expenses: Not hard at all  Food Insecurity: No Food Insecurity (11/15/2023)   Hunger Vital Sign    Worried About Running Out of Food in the Last Year: Never true    Ran Out of Food in the Last Year: Never true  Transportation Needs: No Transportation Needs (11/15/2023)   PRAPARE - Administrator, Civil Service (Medical): No    Lack of Transportation (Non-Medical): No  Physical Activity: Insufficiently Active (11/15/2023)   Exercise Vital Sign    Days  of Exercise per Week: 4 days    Minutes of Exercise per Session: 20 min  Stress: No Stress Concern Present (11/15/2023)   [employer] of Occupational Health - Occupational Stress Questionnaire    Feeling of Stress: Only a little  Social Connections: Moderately Isolated (11/15/2023)   Social Connection and Isolation Panel    Frequency of Communication with Friends and Family: More than three times a week    Frequency of Social Gatherings with Friends and Family: Once a week    Attends Religious Services: Never    Database administrator or Organizations: No    Attends Engineer, structural: Not on file    Marital Status: Married  Catering manager Violence: Not At Risk (01/02/2022)   Humiliation, Afraid, Rape, and Kick questionnaire    Fear of Current or Ex-Partner: No    Emotionally Abused: No    Physically Abused: No    Sexually Abused: No    Outpatient Medications Prior to Visit  Medication Sig Dispense Refill   amphetamine -dextroamphetamine  (ADDERALL XR) 30 MG 24 hr capsule Take 1 capsule (30 mg total) by mouth every morning. 30 capsule 0   amphetamine -dextroamphetamine  (ADDERALL) 20 MG tablet Take 1 tablet (20 mg total) by mouth daily. 15 tablet 0   ANUCORT-HC 25 MG suppository Place 25 mg rectally 2 (two) times daily.     Boric Acid Vaginal (AZO BORIC ACID VA) Insert 1 capsule in vagina every night x 14 days then every Monday and Thursday night     diazepam (VALIUM) 5 MG tablet Insert 1 tablet in vagina at night 3 times a week     docusate sodium  (COLACE) 100 MG capsule Take 100 mg by mouth 2 (two) times daily.     FLUoxetine (PROZAC) 20 MG capsule Take 20 mg by mouth daily.     hydrOXYzine  (ATARAX ) 25 MG tablet Take 1 tablet (25 mg total) by mouth at bedtime. 30 tablet 3   lamoTRIgine (LAMICTAL) 100 MG tablet Take 100 mg by mouth daily.     No facility-administered medications prior to visit.    Allergies  Allergen Reactions   Onion Anaphylaxis and Swelling     Tongue swells and it becomes hard to breathe   Peanut Allergen Powder-Dnfp Anaphylaxis   Peanut Oil Anaphylaxis   Peanut-Containing Drug Products Anaphylaxis and Swelling    Tongue swells and it becomes hard to breathe      Social History   Tobacco Use   Smoking status: Former    Current packs/day: 0.00    Average packs/day: 0.5 packs/day for 0.5 years (0.2 ttl pk-yrs)    Types: Cigarettes    Start date: 09/29/2010    Quit date: 03/30/2011  Years since quitting: 12.6   Smokeless tobacco: Never  Vaping Use   Vaping status: Never Used  Substance Use Topics   Alcohol use: Not Currently   Drug use: No     Review of Systems  Constitutional:  Negative for chills, fever and malaise/fatigue.  HENT:  Negative for congestion, ear pain, sinus pain and sore throat.   Eyes: Negative.   Respiratory:  Negative for cough, shortness of breath and wheezing.   Cardiovascular:  Negative for chest pain, palpitations and leg swelling.  Gastrointestinal:  Negative for constipation, diarrhea, nausea and vomiting.  Genitourinary:  Negative for dysuria, frequency and urgency.  Musculoskeletal: Negative.   Skin: Negative.   Neurological:  Negative for dizziness and headaches.  Endo/Heme/Allergies: Negative.   Psychiatric/Behavioral: Negative.       Labs/Other Tests and Data Reviewed:    Recent Labs: 09/19/2023: ALT 15; BUN 7; Creatinine, Ser 0.76; Hemoglobin 13.6; Platelets 424; Potassium 4.4; Sodium 138; TSH 2.640   Recent Lipid Panel Lab Results  Component Value Date/Time   CHOL 195 09/19/2023 04:22 PM   TRIG 73 09/19/2023 04:22 PM   HDL 58 09/19/2023 04:22 PM   CHOLHDL 3.4 09/19/2023 04:22 PM   LDLCALC 124 (H) 09/19/2023 04:22 PM    Wt Readings from Last 3 Encounters:  10/17/23 97 lb (44 kg)  09/19/23 95 lb (43.1 kg)  12/08/22 94 lb (42.6 kg)     Objective:    Vital Signs:  There were no vitals taken for this visit.   Physical Exam   Unable to perform due to it being a video  visit  ASSESSMENT & PLAN:   There are no diagnoses linked to this encounter.  Assessment and Plan    Attention-Deficit/Hyperactivity Disorder (ADHD) Adderall ineffective for work tasks, effective for household tasks. Fatigue by 6 PM without side effects. Vyvanse  suggested for improved efficacy. - Discontinue extended-release Adderall. - Initiate Vyvanse  30 mg daily in the morning. - Continue short-acting Adderall 20 mg in the afternoon if needed. - Provide 30-day supply of Vyvanse . - Instruct her to monitor for side effects or lack of efficacy and report in two weeks. - Offer follow-up via video visit or MyChart message.       No orders of the defined types were placed in this encounter.    No orders of the defined types were placed in this encounter.    Follow Up:  Virtual Visit  prn  Signed, Jon Birmingham, CMA  11/15/2023 3:18 PM    [facility]

## 2023-11-16 ENCOUNTER — Other Ambulatory Visit: Payer: Self-pay

## 2023-11-16 ENCOUNTER — Encounter: Payer: Self-pay | Admitting: Physician Assistant

## 2023-11-16 DIAGNOSIS — F900 Attention-deficit hyperactivity disorder, predominantly inattentive type: Secondary | ICD-10-CM

## 2023-11-18 MED ORDER — AMPHETAMINE-DEXTROAMPHETAMINE 20 MG PO TABS
20.0000 mg | ORAL_TABLET | Freq: Every day | ORAL | 0 refills | Status: DC
Start: 2023-11-18 — End: 2024-01-07

## 2023-11-21 ENCOUNTER — Encounter: Payer: Self-pay | Admitting: Physician Assistant

## 2023-11-22 ENCOUNTER — Other Ambulatory Visit

## 2023-11-29 ENCOUNTER — Encounter: Payer: Self-pay | Admitting: Physician Assistant

## 2023-12-04 ENCOUNTER — Other Ambulatory Visit: Payer: Self-pay | Admitting: Physician Assistant

## 2023-12-04 DIAGNOSIS — F900 Attention-deficit hyperactivity disorder, predominantly inattentive type: Secondary | ICD-10-CM

## 2023-12-04 MED ORDER — LISDEXAMFETAMINE DIMESYLATE 40 MG PO CAPS: 40.0000 mg | ORAL_CAPSULE | ORAL | 0 refills | Status: DC

## 2023-12-29 ENCOUNTER — Telehealth: Admitting: Family

## 2023-12-29 DIAGNOSIS — M791 Myalgia, unspecified site: Secondary | ICD-10-CM

## 2023-12-29 DIAGNOSIS — M797 Fibromyalgia: Secondary | ICD-10-CM

## 2023-12-29 MED ORDER — PREDNISONE 20 MG PO TABS
40.0000 mg | ORAL_TABLET | Freq: Every day | ORAL | 0 refills | Status: AC
Start: 2023-12-29 — End: 2024-01-03

## 2023-12-29 MED ORDER — DICLOFENAC SODIUM 75 MG PO TBEC
75.0000 mg | DELAYED_RELEASE_TABLET | Freq: Two times a day (BID) | ORAL | 0 refills | Status: AC
Start: 2023-12-29 — End: ?

## 2023-12-29 NOTE — Progress Notes (Signed)
 Virtual Visit Consent   Melissa Ware, you are scheduled for a virtual visit with a [facility] provider today. Just as with appointments in the office, your consent must be obtained to participate. Your consent will be active for this visit and any virtual visit you may have with one of our providers in the next 365 days. If you have a MyChart account, a copy of this consent can be sent to you electronically.  As this is a virtual visit, video technology does not allow for your provider to perform a traditional examination. This may limit your provider's ability to fully assess your condition. If your provider identifies any concerns that need to be evaluated in person or the need to arrange testing (such as labs, EKG, etc.), we will make arrangements to do so. Although advances in technology are sophisticated, we cannot ensure that it will always work on either your end or our end. If the connection with a video visit is poor, the visit may have to be switched to a telephone visit. With either a video or telephone visit, we are not always able to ensure that we have a secure connection.  By engaging in this virtual visit, you consent to the provision of healthcare and authorize for your insurance to be billed (if applicable) for the services provided during this visit. Depending on your insurance coverage, you may receive a charge related to this service.  I need to obtain your verbal consent now. Are you willing to proceed with your visit today? Melissa Ware has provided verbal consent on 12/29/2023 for a virtual visit (video or telephone). Bari Learn, FNP  Date: 12/29/2023 1:17 PM   Virtual Visit via Video Note   I, Bari Learn, connected with  Melissa Ware  ([id number], 01-Apr-1991) on 12/29/23 at  1:15 PM EDT by a video-enabled telemedicine application and verified that I am speaking with the correct person using two identifiers.  Location: Patient: Virtual Visit Location Patient:  Other: car Provider: Virtual Visit Location Provider: Home Office   I discussed the limitations of evaluation and management by telemedicine and the availability of in person appointments. The patient expressed understanding and agreed to proceed.    History of Present Illness: Melissa Ware is a 33 y.o. who identifies as a female who was assigned female at birth, and is being seen today for myalgia. She reports she has fibromyalgia and feels like she is having a flare up. Reports over the last week she has felt stiff, muscle pain, and skin pain. She took a muscle relaxer with mild relief.    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 01/12/2023   Acute right-sided back pain 01/28/2020   IC (interstitial cystitis) 01/28/2020   Other fatigue 01/28/2020   Myalgia, unspecified site 01/28/2020   Arthralgia 01/28/2020   Neck pain 01/28/2020   CIN I (cervical intraepithelial neoplasia I) 07/01/2019   SGA (small for gestational age) 12/30/2013    Allergies:  Allergies  Allergen Reactions   Onion Anaphylaxis and Swelling    Tongue swells and it becomes hard to breathe   Peanut Allergen Powder-Dnfp Anaphylaxis   Peanut Oil Anaphylaxis   Peanut-Containing Drug Products Anaphylaxis and Swelling  Tongue swells and it becomes hard to breathe    Medications:  Current Outpatient Medications:    diclofenac  (VOLTAREN ) 75 MG EC tablet, Take 1 tablet (75 mg total) by mouth 2 (two) times daily., Disp: 30 tablet, Rfl: 0   predniSONE  (DELTASONE ) 20 MG tablet, Take 2 tablets (40 mg total) by mouth daily with breakfast for 5 days., Disp: 10 tablet, Rfl: 0    amphetamine -dextroamphetamine  (ADDERALL) 20 MG tablet, Take 1 tablet (20 mg total) by mouth daily., Disp: 30 tablet, Rfl: 0   ANUCORT-HC 25 MG suppository, Place 25 mg rectally 2 (two) times daily., Disp: , Rfl:    diazepam (VALIUM) 5 MG tablet, Insert 1 tablet in vagina at night 3 times a week, Disp: , Rfl:    docusate sodium  (COLACE) 100 MG capsule, Take 100 mg by mouth 2 (two) times daily., Disp: , Rfl:    FLUoxetine (PROZAC) 20 MG capsule, Take 20 mg by mouth daily., Disp: , Rfl:    lisdexamfetamine (VYVANSE ) 40 MG capsule, Take 1 capsule (40 mg total) by mouth every morning., Disp: 30 capsule, Rfl: 0  Observations/Objective: Patient is well-developed, well-nourished in no acute distress.  Resting comfortably  Head is normocephalic, atraumatic.  No labored breathing.  Speech is clear and coherent with logical content.  Patient is alert and oriented at baseline.    Assessment and Plan: 1. Myalgia (Primary) - predniSONE  (DELTASONE ) 20 MG tablet; Take 2 tablets (40 mg total) by mouth daily with breakfast for 5 days.  Dispense: 10 tablet; Refill: 0 - diclofenac  (VOLTAREN ) 75 MG EC tablet; Take 1 tablet (75 mg total) by mouth 2 (two) times daily.  Dispense: 30 tablet; Refill: 0  2. Fibromyalgia - predniSONE  (DELTASONE ) 20 MG tablet; Take 2 tablets (40 mg total) by mouth daily with breakfast for 5 days.  Dispense: 10 tablet; Refill: 0 - diclofenac  (VOLTAREN ) 75 MG EC tablet; Take 1 tablet (75 mg total) by mouth 2 (two) times daily.  Dispense: 30 tablet; Refill: 0  Start diclofenac  BID with food, no other NSAIDs  Start prednisone   Continue muscle relaxer  Encourage ROM exercises  Follow up if symptoms worsen or do not improve   Follow Up Instructions: I discussed the assessment and treatment plan with the patient. The patient was provided an opportunity to ask questions and all were answered. The patient agreed with the plan and demonstrated an understanding of the instructions.  A copy  of instructions were sent to the patient via MyChart unless otherwise noted below.     The patient was advised to call back or seek an in-person evaluation if the symptoms worsen or if the condition fails to improve as anticipated.    Bari Learn, FNP

## 2023-12-29 NOTE — Patient Instructions (Signed)
Myofascial Pain Syndrome and Fibromyalgia Myofascial pain syndrome and fibromyalgia are both pain disorders. You may feel this pain mainly in your muscles. Myofascial pain syndrome: Always has tender points in the muscles that will cause pain when pressed (trigger points). The pain may come and go. Usually affects your neck, upper back, and shoulder areas. The pain often moves into your arms and hands. Fibromyalgia: Has muscle pains and tenderness that come and go. Is often associated with tiredness (fatigue) and sleep problems. Has trigger points. Tends to be long-lasting (chronic), but is not life-threatening. Fibromyalgia and myofascial pain syndrome are not the same. However, they often occur together. If you have both conditions, each can make the other worse. Both are common and can cause enough pain and fatigue to make day-to-day activities difficult. Both can be hard to diagnose because their symptoms are common in many other conditions. What are the causes? The exact causes of these conditions are not known. What increases the risk? You are more likely to develop either of these conditions if: You have a family history of the condition. You are female. You have certain triggers, such as: Spine disorders. An injury (trauma) or other physical stressors. Being under a lot of stress. Medical conditions such as osteoarthritis, rheumatoid arthritis, or lupus. What are the signs or symptoms? Fibromyalgia The main symptom of fibromyalgia is widespread pain and tenderness in your muscles. Pain is sometimes described as stabbing, shooting, or burning. You may also have: Tingling or numbness. Sleep problems and fatigue. Problems with attention and concentration (fibro fog). Other symptoms may include: Bowel and bladder problems. Headaches. Vision problems. Sensitivity to odors and noises. Depression or mood changes. Painful menstrual periods (dysmenorrhea). Dry skin or eyes. These  symptoms can vary over time. Myofascial pain syndrome Symptoms of myofascial pain syndrome include: Tight, ropy bands of muscle. Uncomfortable sensations in muscle areas. These may include aching, cramping, burning, numbness, tingling, and weakness. Difficulty moving certain parts of the body freely (poor range of motion). How is this diagnosed? This condition may be diagnosed by your symptoms and medical history. You will also have a physical exam. In general: Fibromyalgia is diagnosed if you have pain, fatigue, and other symptoms for more than 3 months, and symptoms cannot be explained by another condition. Myofascial pain syndrome is diagnosed if you have trigger points in your muscles, and those trigger points are tender and cause pain elsewhere in your body (referred pain). How is this treated? Treatment for these conditions depends on the type that you have. For fibromyalgia, a healthy lifestyle is the most important treatment including aerobic and strength exercises. Different types of medicines are used to help treat pain and include: NSAIDs. Medicines for treating depression. Medicines that help control seizures. Medicines that relax the muscles. Treatment for myofascial pain syndrome includes: Pain medicines, such as NSAIDs. Cooling and stretching of muscles. Massage therapy with myofascial release technique. Trigger point injections. Treating these conditions often requires a team of health care providers. These may include: Your primary care provider. A physical therapist. Complementary health care providers, such as massage therapists or acupuncturists. A psychiatrist for cognitive behavioral therapy. Follow these instructions at home: Medicines Take over-the-counter and prescription medicines only as told by your health care provider. Ask your health care provider if the medicine prescribed to you: Requires you to avoid driving or using machinery. Can cause constipation.  You may need to take these actions to prevent or treat constipation: Drink enough fluid to keep your urine pale   yellow. Take over-the-counter or prescription medicines. Eat foods that are high in fiber, such as beans, whole grains, and fresh fruits and vegetables. Limit foods that are high in fat and processed sugars, such as fried or sweet foods. Lifestyle  Do exercises as told by your health care provider or physical therapist. Practice relaxation techniques to control your stress. You may want to try: Biofeedback. Visual imagery. Hypnosis. Muscle relaxation. Yoga. Meditation. Maintain a healthy lifestyle. This includes eating a healthy diet and getting enough sleep. Do not use any products that contain nicotine or tobacco. These products include cigarettes, chewing tobacco, and vaping devices, such as e-cigarettes. If you need help quitting, ask your health care provider. General instructions Talk to your health care provider about complementary treatments, such as acupuncture or massage. Do not do activities that stress or strain your muscles. This includes repetitive motions and heavy lifting. Keep all follow-up visits. This is important. Where to find support Consider joining a support group with others who are diagnosed with this condition. National Fibromyalgia Association: fmaware.org Where to find more information [place]. Pain Foundation: uspainfoundation.org Contact a health care provider if: You have new symptoms. Your symptoms get worse or your pain is severe. You have side effects from your medicines. You have trouble sleeping. Your condition is causing depression or anxiety. Get help right away if: You have thoughts of hurting yourself or others. Get help right away if you feel like you may hurt yourself or others, or have thoughts about taking your own life. Go to your nearest emergency room or: Call 911. Call the National Suicide Prevention Lifeline at 1-800-273-8255  or 988. This is open 24 hours a day. Text the Crisis Text Line at 741741. This information is not intended to replace advice given to you by your health care provider. Make sure you discuss any questions you have with your health care provider. Document Revised: 01/16/2022 Document Reviewed: 03/11/2021 Elsevier Patient Education  2024 [employer]  

## 2024-01-04 ENCOUNTER — Encounter: Payer: Self-pay | Admitting: Physician Assistant

## 2024-01-07 ENCOUNTER — Other Ambulatory Visit: Payer: Self-pay

## 2024-01-07 DIAGNOSIS — F900 Attention-deficit hyperactivity disorder, predominantly inattentive type: Secondary | ICD-10-CM

## 2024-01-07 MED ORDER — AMPHETAMINE-DEXTROAMPHETAMINE 20 MG PO TABS
20.0000 mg | ORAL_TABLET | Freq: Every day | ORAL | 0 refills | Status: AC
Start: 2024-01-07 — End: ?

## 2024-01-08 ENCOUNTER — Encounter: Payer: Self-pay | Admitting: Physician Assistant

## 2024-01-08 ENCOUNTER — Other Ambulatory Visit: Payer: Self-pay

## 2024-01-08 DIAGNOSIS — F900 Attention-deficit hyperactivity disorder, predominantly inattentive type: Secondary | ICD-10-CM

## 2024-01-08 MED ORDER — LISDEXAMFETAMINE DIMESYLATE 40 MG PO CAPS: 40.0000 mg | ORAL_CAPSULE | ORAL | 0 refills | Status: AC

## 2024-02-01 ENCOUNTER — Other Ambulatory Visit: Payer: Self-pay | Admitting: Medical Genetics

## 2024-02-01 ENCOUNTER — Encounter: Payer: Self-pay | Admitting: *Deleted

## 2024-02-01 DIAGNOSIS — Z006 Encounter for examination for normal comparison and control in clinical research program: Secondary | ICD-10-CM

## 2024-02-07 ENCOUNTER — Encounter: Payer: Self-pay | Admitting: Physician Assistant

## 2024-03-02 LAB — GENECONNECT MOLECULAR SCREEN: Genetic Analysis Overall Interpretation: NEGATIVE
